# Patient Record
Sex: Male | Born: 1970
Health system: Southern US, Community
[De-identification: ages and names within clinical notes are randomized; demographics above are authoritative.]

## PROBLEM LIST (undated history)

## (undated) DIAGNOSIS — I10 Essential (primary) hypertension: Secondary | ICD-10-CM

## (undated) DIAGNOSIS — F419 Anxiety disorder, unspecified: Secondary | ICD-10-CM

## (undated) HISTORY — PX: SHOULDER SURGERY: SHX246

---

## 2007-07-05 ENCOUNTER — Ambulatory Visit (HOSPITAL_COMMUNITY): Admission: RE | Admit: 2007-07-05 | Discharge: 2007-07-05 | Payer: Self-pay | Admitting: Cardiology

## 2008-02-12 ENCOUNTER — Encounter: Admission: RE | Admit: 2008-02-12 | Discharge: 2008-02-12 | Payer: Self-pay | Admitting: Family Medicine

## 2009-10-31 ENCOUNTER — Emergency Department (HOSPITAL_COMMUNITY)
Admission: EM | Admit: 2009-10-31 | Discharge: 2009-10-31 | Payer: Self-pay | Source: Home / Self Care | Admitting: Emergency Medicine

## 2009-11-01 ENCOUNTER — Emergency Department (HOSPITAL_BASED_OUTPATIENT_CLINIC_OR_DEPARTMENT_OTHER): Admission: EM | Admit: 2009-11-01 | Discharge: 2009-11-01 | Payer: Self-pay | Admitting: Emergency Medicine

## 2010-04-14 LAB — CBC
HCT: 47.4 % (ref 39.0–52.0)
Hemoglobin: 16.5 g/dL (ref 13.0–17.0)
MCH: 31 pg (ref 26.0–34.0)
MCHC: 34.9 g/dL (ref 30.0–36.0)
MCV: 88.7 fL (ref 78.0–100.0)
RBC: 5.34 MIL/uL (ref 4.22–5.81)

## 2010-04-14 LAB — BASIC METABOLIC PANEL
CO2: 25 mEq/L (ref 19–32)
Calcium: 9.1 mg/dL (ref 8.4–10.5)
Chloride: 107 mEq/L (ref 96–112)
GFR calc Af Amer: >60 mL/min (ref >60)
Glucose, Bld: 87 mg/dL (ref 70–99)
Potassium: 4.2 mEq/L (ref 3.5–5.1)
Sodium: 137 mEq/L (ref 135–145)

## 2010-04-14 LAB — POCT CARDIAC MARKERS
CKMB, poc: <1 ng/mL — ABNORMAL LOW (ref 1.0–8.0)
Myoglobin, poc: 54.5 ng/mL (ref 12–200)
Myoglobin, poc: 68.2 ng/mL (ref 12–200)

## 2010-04-14 LAB — DIFFERENTIAL
Basophils Relative: 1 % (ref 0–1)
Eosinophils Absolute: 0.1 10*3/uL (ref 0.0–0.7)
Lymphs Abs: 2.6 10*3/uL (ref 0.7–4.0)
Monocytes Absolute: 0.5 10*3/uL (ref 0.1–1.0)
Monocytes Relative: 5 % (ref 3–12)
Neutrophils Relative %: 66 % (ref 43–77)

## 2010-08-21 ENCOUNTER — Encounter: Payer: Self-pay | Admitting: Emergency Medicine

## 2010-08-21 ENCOUNTER — Emergency Department (HOSPITAL_BASED_OUTPATIENT_CLINIC_OR_DEPARTMENT_OTHER)
Admission: EM | Admit: 2010-08-21 | Discharge: 2010-08-21 | Disposition: A | Discharge status: Home / Self Care | Payer: BC Managed Care – PPO | Attending: Emergency Medicine | Admitting: Emergency Medicine

## 2010-08-21 ENCOUNTER — Emergency Department (INDEPENDENT_AMBULATORY_CARE_PROVIDER_SITE_OTHER): Payer: BC Managed Care – PPO

## 2010-08-21 DIAGNOSIS — R109 Unspecified abdominal pain: Secondary | ICD-10-CM | POA: Insufficient documentation

## 2010-08-21 DIAGNOSIS — I10 Essential (primary) hypertension: Secondary | ICD-10-CM | POA: Insufficient documentation

## 2010-08-21 HISTORY — DX: Essential (primary) hypertension: I10

## 2010-08-21 LAB — URINALYSIS, ROUTINE W REFLEX MICROSCOPIC
Glucose, UA: NEGATIVE mg/dL
Hgb urine dipstick: NEGATIVE
Ketones, ur: NEGATIVE mg/dL
Protein, ur: NEGATIVE mg/dL
Urobilinogen, UA: 0.2 mg/dL (ref 0.0–1.0)

## 2010-08-21 LAB — BASIC METABOLIC PANEL
Calcium: 9.4 mg/dL (ref 8.4–10.5)
Creatinine, Ser: 1 mg/dL (ref 0.50–1.35)
GFR calc Af Amer: >60 mL/min (ref >60)
GFR calc non Af Amer: >60 mL/min (ref >60)

## 2010-08-21 LAB — CBC
HCT: 46.9 % (ref 39.0–52.0)
MCHC: 34.5 g/dL (ref 30.0–36.0)
MCV: 86.7 fL (ref 78.0–100.0)
Platelets: 212 10*3/uL (ref 150–400)
RDW: 13.9 % (ref 11.5–15.5)

## 2010-08-21 LAB — DIFFERENTIAL
Basophils Absolute: 0 10*3/uL (ref 0.0–0.1)
Basophils Relative: 1 % (ref 0–1)
Eosinophils Absolute: 0.3 10*3/uL (ref 0.0–0.7)
Eosinophils Relative: 3 % (ref 0–5)
Monocytes Absolute: 0.8 10*3/uL (ref 0.1–1.0)

## 2010-08-21 MED ORDER — SODIUM CHLORIDE 0.9 % IV BOLUS (SEPSIS)
500.0000 mL | Freq: Once | INTRAVENOUS | Status: AC
  Administered 2010-08-21: 500 mL via INTRAVENOUS

## 2010-08-21 MED ORDER — KETOROLAC TROMETHAMINE 30 MG/ML IJ SOLN
30.0000 mg | Freq: Once | INTRAMUSCULAR | Status: AC
  Administered 2010-08-21: 30 mg via INTRAVENOUS
  Filled 2010-08-21: qty 1

## 2010-08-21 MED ORDER — MORPHINE SULFATE 4 MG/ML IJ SOLN
4.0000 mg | Freq: Once | INTRAMUSCULAR | Status: AC
  Administered 2010-08-21: 4 mg via INTRAVENOUS
  Filled 2010-08-21: qty 1

## 2010-08-21 MED ORDER — ONDANSETRON HCL 4 MG/2ML IJ SOLN
4.0000 mg | Freq: Once | INTRAMUSCULAR | Status: AC
  Administered 2010-08-21: 4 mg via INTRAVENOUS
  Filled 2010-08-21: qty 2

## 2010-08-21 NOTE — ED Provider Notes (Signed)
History     Chief Complaint  Patient presents with  . Flank Pain   HPI Comments: Patient complains of left flank pain that does radiate down his leg since approximately one week ago. He states that it does feel like his past kidney stones. He has not noted any hematuria or dysuria. He's had no fevers. He's had some nausea but no vomiting. He did see his primary care physician on Thursday who prescribed him Percocet and Phenergan for his symptoms. He notes that they made him significantly somnolent. I he's been trying to increase his intake of water as he knows that that helps him past kidney stones but has not noted that he is actually passed a stone yet. He has no fevers. Normal bowel movements. No abdominal pain. No other noted injury or trauma.  Patient is a 40 y.o. male presenting with flank pain. The history is provided by the patient and the spouse.  Flank Pain This is a recurrent problem. The current episode started more than 2 days ago. The problem occurs daily. The problem has not changed since onset.Pertinent negatives include no chest pain, no abdominal pain, no headaches and no shortness of breath. The symptoms are aggravated by nothing. The symptoms are relieved by narcotics. He has tried water and rest for the symptoms. The treatment provided moderate relief.    Past Medical History  Diagnosis Date  . Kidney stones   . Hypertension   . Legionnaire disease     Past Surgical History  Procedure Date  . Shoulder surgery     No family history on file.  History  Substance Use Topics  . Smoking status: Current Everyday Smoker  . Smokeless tobacco: Current User  . Alcohol Use: No      Review of Systems  Constitutional: Negative.  Negative for fever and chills.  HENT: Negative.   Eyes: Negative.  Negative for discharge and redness.  Respiratory: Negative.  Negative for cough and shortness of breath.   Cardiovascular: Negative.  Negative for chest pain.    Gastrointestinal: Negative.  Negative for nausea, vomiting and abdominal pain.  Genitourinary: Positive for flank pain. Negative for hematuria.  Musculoskeletal: Negative.  Negative for back pain.  Skin: Negative.  Negative for color change and rash.  Neurological: Negative for syncope and headaches.  Hematological: Negative.  Negative for adenopathy.  Psychiatric/Behavioral: Negative.  Negative for confusion.  All other systems reviewed and are negative.    Physical Exam  BP 133/91  Pulse 67  Temp(Src) 98 F (36.7 C) (Oral)  Resp 16  Ht 6' (1.829 m)  Wt 235 lb (106.595 kg)  BMI 31.87 kg/m2  SpO2 98%  Physical Exam  Constitutional: He is oriented to person, place, and time. He appears well-developed and well-nourished.  Non-toxic appearance. He does not have a sickly appearance.  HENT:  Head: Normocephalic and atraumatic.  Eyes: Conjunctivae, EOM and lids are normal. Pupils are equal, round, and reactive to light.  Neck: Trachea normal, normal range of motion and full passive range of motion without pain. Neck supple.  Cardiovascular: Regular rhythm and normal heart sounds.   Pulmonary/Chest: Effort normal and breath sounds normal. No respiratory distress.  Abdominal: Soft. Normal appearance. He exhibits no distension. There is no tenderness. There is no rebound and no CVA tenderness.  Musculoskeletal: Normal range of motion.  Neurological: He is alert and oriented to person, place, and time. He has normal strength.  Skin: Skin is warm, dry and intact. No rash noted.  Psychiatric: He has a normal mood and affect. His speech is normal and behavior is normal. Judgment and thought content normal. Cognition and memory are normal.    ED Course  Procedures  MDM  Patient's history was concerning for possible renal stone. At this time he has no urinary tract infection or hematuria. Patient has no signs of stone on his CAT scan at this time. He has no signs of other intra-abdominal  pathology. He has a benign abdominal exam and so corresponds with lack of other intra-abdominal pathology. He is at his normal bowel movements. Patient has no fevers no signs of acute infection. At this point in time I will discharge the patient is instructions to followup with his primary care physician this week he may potentially have a musculoskeletal injury. 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  Results for orders placed during the hospital encounter of 08/21/10  URINALYSIS, ROUTINE W REFLEX MICROSCOPIC      Component Value Range   Color, Urine YELLOW  YELLOW    Appearance CLEAR  CLEAR    Specific Gravity, Urine 1.020  1.005 - 1.030    pH 7.0  5.0 - 8.0    Glucose, UA NEGATIVE  NEGATIVE (mg/dL)   Hgb urine dipstick NEGATIVE  NEGATIVE    Bilirubin Urine NEGATIVE  NEGATIVE    Ketones, ur NEGATIVE  NEGATIVE (mg/dL)   Protein, ur NEGATIVE  NEGATIVE (mg/dL)   Urobilinogen, UA 0.2  0.0 - 1.0 (mg/dL)   Nitrite NEGATIVE  NEGATIVE    Leukocytes, UA NEGATIVE  NEGATIVE   CBC      Component Value Range   WBC 8.8  4.0 - 10.5 (K/uL)   RBC 5.41  4.22 - 5.81 (MIL/uL)   Hemoglobin 16.2  13.0 - 17.0 (g/dL)   HCT 96.0  45.4 - 09.8 (%)   MCV 86.7  78.0 - 100.0 (fL)   MCH 29.9  26.0 - 34.0 (pg)   MCHC 34.5  30.0 - 36.0 (g/dL)   RDW 11.9  14.7 - 82.9 (%)   Platelets 212  150 - 400 (K/uL)  DIFFERENTIAL      Component Value Range   Neutrophils Relative 53  43 - 77 (%)   Neutro Abs 4.7  1.7 - 7.7 (K/uL)   Lymphocytes Relative 35  12 - 46 (%)   Lymphs Abs 3.0  0.7 - 4.0 (K/uL)   Monocytes Relative 9  3 - 12 (%)   Monocytes Absolute 0.8  0.1 - 1.0 (K/uL)   Eosinophils Relative 3  0 - 5 (%)   Eosinophils Absolute 0.3  0.0 - 0.7 (K/uL)   Basophils Relative 1  0 - 1 (%)   Basophils Absolute 0.0  0.0 - 0.1 (K/uL)  BASIC METABOLIC PANEL      Component Value Range   Sodium 141  135 - 145 (mEq/L)   Potassium 4.0  3.5 - 5.1 (mEq/L)   Chloride 104  96 - 112 (mEq/L)   CO2  28  19 - 32 (mEq/L)   Glucose, Bld 84  70 - 99 (mg/dL)   BUN 15  6 - 23 (mg/dL)   Creatinine, Ser 5.62  0.50 - 1.35 (mg/dL)   Calcium 9.4  8.4 - 13.0 (mg/dL)   GFR calc non Af Amer >60  >60 (mL/min)   GFR calc Af Amer >60  >60 (mL/min)   Ct Abdomen Pelvis Wo Contrast  08/21/2010  *RADIOLOGY REPORT*  Clinical Data: Left  flank pain.  CT ABDOMEN AND PELVIS WITHOUT CONTRAST  Technique:  Multidetector CT imaging of the abdomen and pelvis was performed following the standard protocol without intravenous contrast.  Comparison: CT scan 02/12/2008.  Findings: The lung bases are clear except for dependent atelectasis.  The unenhanced appearance of the liver is unremarkable.  The gallbladder appears normal.  No common bile duct dilatation.  The pancreas appears normal.  The spleen is normal in size.  No focal lesions.  The adrenal glands and kidneys are normal.  No hydronephrosis.  No obstructing ureteral calculi.  No bladder calculi.  The stomach, duodenum, small bowel and colon demonstrate no significant abnormalities without oral contrast.  The appendix is normal.  No mesenteric or retroperitoneal masses or adenopathy. The aorta is normal.  The bladder, prostate gland and seminal vesicles appear normal.  No pelvic mass, adenopathy or free pelvic fluid collections.  No inguinal mass or hernia.  The bony pelvis is intact.  IMPRESSION: Unremarkable noncontrast CT examination of the abdomen/pelvis.  Original Report Authenticated By: P. Loralie Champagne, M.D.          Nat Christen, MD 08/21/10 2032

## 2010-08-21 NOTE — ED Notes (Signed)
Pt c/o LT flank pain since Fri- saw PMD for same, has rx for pain, not having relief

## 2010-11-01 ENCOUNTER — Emergency Department (HOSPITAL_BASED_OUTPATIENT_CLINIC_OR_DEPARTMENT_OTHER)
Admission: EM | Admit: 2010-11-01 | Discharge: 2010-11-01 | Disposition: A | Discharge status: Home / Self Care | Payer: BC Managed Care – PPO | Attending: Emergency Medicine | Admitting: Emergency Medicine

## 2010-11-01 ENCOUNTER — Emergency Department (INDEPENDENT_AMBULATORY_CARE_PROVIDER_SITE_OTHER): Payer: BC Managed Care – PPO

## 2010-11-01 ENCOUNTER — Other Ambulatory Visit: Payer: Self-pay

## 2010-11-01 ENCOUNTER — Encounter (HOSPITAL_BASED_OUTPATIENT_CLINIC_OR_DEPARTMENT_OTHER): Payer: Self-pay | Admitting: *Deleted

## 2010-11-01 DIAGNOSIS — R079 Chest pain, unspecified: Secondary | ICD-10-CM | POA: Insufficient documentation

## 2010-11-01 DIAGNOSIS — I10 Essential (primary) hypertension: Secondary | ICD-10-CM | POA: Insufficient documentation

## 2010-11-01 DIAGNOSIS — F411 Generalized anxiety disorder: Secondary | ICD-10-CM | POA: Insufficient documentation

## 2010-11-01 DIAGNOSIS — R9431 Abnormal electrocardiogram [ECG] [EKG]: Secondary | ICD-10-CM

## 2010-11-01 DIAGNOSIS — R0789 Other chest pain: Secondary | ICD-10-CM

## 2010-11-01 DIAGNOSIS — F17200 Nicotine dependence, unspecified, uncomplicated: Secondary | ICD-10-CM | POA: Insufficient documentation

## 2010-11-01 DIAGNOSIS — F419 Anxiety disorder, unspecified: Secondary | ICD-10-CM

## 2010-11-01 DIAGNOSIS — Z79899 Other long term (current) drug therapy: Secondary | ICD-10-CM | POA: Insufficient documentation

## 2010-11-01 LAB — BASIC METABOLIC PANEL
Chloride: 107 mEq/L (ref 96–112)
GFR calc Af Amer: >90 mL/min (ref >90)
GFR calc non Af Amer: >90 mL/min (ref >90)
Glucose, Bld: 110 mg/dL — ABNORMAL HIGH (ref 70–99)
Potassium: 3.7 mEq/L (ref 3.5–5.1)
Sodium: 140 mEq/L (ref 135–145)

## 2010-11-01 LAB — TROPONIN I: Troponin I: <0.3 ng/mL (ref <0.30)

## 2010-11-01 LAB — DIFFERENTIAL
Basophils Relative: 1 % (ref 0–1)
Eosinophils Absolute: 0.2 10*3/uL (ref 0.0–0.7)
Lymphs Abs: 2.6 10*3/uL (ref 0.7–4.0)
Neutro Abs: 4 10*3/uL (ref 1.7–7.7)
Neutrophils Relative %: 54 % (ref 43–77)

## 2010-11-01 LAB — CBC
MCH: 30.4 pg (ref 26.0–34.0)
Platelets: 237 10*3/uL (ref 150–400)
RBC: 5.06 MIL/uL (ref 4.22–5.81)

## 2010-11-01 MED ORDER — LORAZEPAM 1 MG PO TABS
1.0000 mg | ORAL_TABLET | Freq: Once | ORAL | Status: AC
  Administered 2010-11-01: 1 mg via ORAL
  Filled 2010-11-01: qty 1

## 2010-11-01 NOTE — ED Provider Notes (Signed)
History     CSN: [id number] Arrival date & time: 11/01/2010  2:59 PM  Chief Complaint  Patient presents with  . Chest Pain    (Consider location/radiation/quality/duration/timing/severity/associated sxs/prior treatment) HPI Comments: Patient was sent by his primary care provider. Patient went to see them today as he had been having increasing left-sided chest pain with radiation to his left neck. The ascending colon on over the past 3 weeks. During this time the patient's normal prescription for Klonopin 3 mg by mouth daily had been inadvertently decreased to 1 mg by mouth daily after a computer system change at his PCPs office. Patient had been doing okay with this. 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 She's been back this week in his chest pain had returned. He had associated this with his anxiety and followed up with his primary care provider. They did an EKG today that showed T-wave inversions in several leads that were new for the patient. For this reason he was sent for evaluation.  Patient is a 40 y.o. male presenting with chest pain. The history is provided by the patient. No language interpreter was used.  Chest Pain The chest pain began more than 2 weeks ago. Chest pain occurs intermittently. The chest pain is improving. The pain is associated with stress. At its most intense, the pain is at 4/10. The pain is currently at 1/10. The severity of the pain is moderate. The quality of the pain is described as aching. The pain does not radiate. Chest pain is worsened by stress. Pertinent negatives for primary symptoms include no fever, no fatigue, no syncope, no shortness of breath, no cough, no wheezing, no palpitations, no abdominal pain, no nausea, no vomiting, no dizziness and no altered mental status. Treatments tried: Anxiety medications. Risk factors include male gender and stress. Past medical history  comments: No significant past medical history. Family history comments: No significant family history.  Procedure history is positive for exercise treadmill test. Procedure history comments: This was negative several years ago.     Past Medical History  Diagnosis Date  . Kidney stones   . Hypertension   . Legionnaire disease     Past Surgical History  Procedure Date  . Shoulder surgery     No family history on file.  History  Substance Use Topics  . Smoking status: Current Everyday Smoker  . Smokeless tobacco: Current User  . Alcohol Use: No      Review of Systems  Constitutional: Negative.  Negative for fever and fatigue.  HENT: Negative.   Eyes: Negative.   Respiratory: Negative for cough, shortness of breath and wheezing.   Cardiovascular: Positive for chest pain. Negative for palpitations and syncope.  Gastrointestinal: Negative.  Negative for nausea, vomiting and abdominal pain.  Genitourinary: Negative.   Musculoskeletal: Negative.   Skin: Negative.   Neurological: Negative.  Negative for dizziness.  Hematological: Negative.   Psychiatric/Behavioral: Negative for altered mental status.       Anxiety  All other systems reviewed and are negative.    Allergies  Review of patient's allergies indicates no known allergies.  Home Medications   Current Outpatient Rx  Name Route Sig Dispense Refill  . CLONAZEPAM 1 MG PO TABS Oral Take 1 mg by mouth 3 (three) times daily as needed. For anxiety      . METOPROLOL TARTRATE 100 MG PO TABS Oral Take 100 mg by  mouth daily.      Marland Kitchen RANITIDINE HCL 150 MG PO TABS Oral Take 150 mg by mouth daily as needed. Acid reflux      . TRAMADOL HCL 50 MG PO TABS Oral Take 50-100 mg by mouth every 6 (six) hours as needed. For pain       BP 146/88  Pulse 73  Temp(Src) 98.2 F (36.8 C) (Oral)  Resp 20  SpO2 98%  Physical Exam  Nursing note and vitals reviewed. Constitutional: He is oriented to person, place, and time. He  appears well-developed and well-nourished. No distress.  HENT:  Head: Normocephalic and atraumatic.  Eyes: Conjunctivae and EOM are normal. Pupils are equal, round, and reactive to light.  Neck: Normal range of motion. Neck supple.  Cardiovascular: Normal rate, regular rhythm, normal heart sounds and intact distal pulses.  Exam reveals no gallop and no friction rub.   No murmur heard. Pulmonary/Chest: Effort normal and breath sounds normal. No respiratory distress. He has no wheezes. He has no rales.  Abdominal: Soft. Bowel sounds are normal. He exhibits no distension. There is no tenderness. There is no rebound and no guarding.  Musculoskeletal: Normal range of motion. He exhibits no edema and no tenderness.  Neurological: He is alert and oriented to person, place, and time. No cranial nerve deficit. He exhibits normal muscle tone. Coordination normal.  Skin: Skin is warm and dry. No rash noted.  Psychiatric:       Anxious    ED Course  Procedures (including critical care time)   Date: 11/01/2010  Rate: 70  Rhythm: normal sinus rhythm  QRS Axis: normal  Intervals: normal  ST/T Wave abnormalities: nonspecific T wave changes  Conduction Disutrbances:Incomplete right bundle-branch block  Narrative Interpretation: No change from EKG obtained at PCP office today. Patient has new T-wave inversions in leads 3, aVF, and V4 through V6 from previous EKG on 10/31/2009.  Old EKG Reviewed: changes noted   Labs Reviewed  BASIC METABOLIC PANEL - Abnormal; Notable for the following:    Glucose, Bld 110 (*)    All other components within normal limits  CBC  DIFFERENTIAL  TROPONIN I   Dg Chest 2 View  11/01/2010  *RADIOLOGY REPORT*  Clinical Data: Abnormal EKG.  Some chest tightness.  CHEST - 2 VIEW  Comparison: 10/31/2009  Findings:  The heart size and mediastinal contours are within normal limits.  Both lungs are clear.  The visualized skeletal structures are unremarkable. No significant  change from priors.  IMPRESSION: No active cardiopulmonary disease.  Original Report Authenticated By: Elsie Stain, M.D.     1. Chest pain   2. Anxiety       MDM  Patient was evaluated by myself and was quite anxious. He did report that he had been having intermittent episodes of chest pain that these were similar to all of the anxiety attacks he has had in the past. Patient has seen a cardiologist previously and had complete workup including negative stress test. Patient is here today as his primary care provider sent him after hent discovering nonspecific T wave changes on his EKG. Patient has only vague discomfort in his chest which he reports is consistent with his anxiety. Though his PCP increase his anxiety prescription dosage today he has not had a chance to take it. Patient does have nonspecific T wave changes noted. Patient will be given Ativan 1 mg by mouth here. Additionally chest x-ray, CBC, been met, troponin will be ordered and reviewed.  Patient is hemodynamically stable at this time.  5:18 PM Patient felt much better following Ativan. Troponin, CBC, renal panel, and chest x-ray were all unremarkable. Given that patient's symptoms have been present for over 8 hours no additional cardiac enzymes were felt necessary today. Patient was discharged home in good condition. He did ask that a copy of his report be forwarded to his primary care physician. This information was given to her secretary and she will attempt to take care of this. Additionally the patient was given a work excuse for tomorrow in case he is still waiting to get back to baseline as he will be starting his new dose of Klonopin tomorrow morning.        Cyndra Numbers, MD 11/01/10 1719

## 2010-11-01 NOTE — ED Provider Notes (Deleted)
Date: 11/01/2010  Rate: 70  Rhythm: normal sinus rhythm  QRS Axis: normal  Intervals: normal  ST/T Wave abnormalities: nonspecific T wave changes  Conduction Disutrbances:Incomplete right bundle-branch block  Narrative Interpretation: No change from EKG obtained at PCP office today. Patient has new T-wave inversions in leads 3, aVF, and V4 through V6 from previous EKG on 10/31/2009.  Old EKG Reviewed: changes noted   Cyndra Numbers, MD 11/01/10 1459

## 2010-11-01 NOTE — ED Notes (Signed)
4 years ago chest pain. Was diagnosed with anxiety. Has been under increased stress recently and Klonipin was decreased from 3mg  to 1mg . Since that time he has been having anxiety attacks. Today after carried his 25# son up stairs he got sob and had tightness in his chest. States this is how he normally feels when he is having anxiety. Was concerned enough to go see his MD where he had a questionable EKG. Was sent here for further evaluation.

## 2011-02-10 ENCOUNTER — Emergency Department (HOSPITAL_BASED_OUTPATIENT_CLINIC_OR_DEPARTMENT_OTHER)
Admission: EM | Admit: 2011-02-10 | Discharge: 2011-02-10 | Disposition: A | Discharge status: Home / Self Care | Payer: Worker's Compensation | Attending: Emergency Medicine | Admitting: Emergency Medicine

## 2011-02-10 ENCOUNTER — Encounter (HOSPITAL_BASED_OUTPATIENT_CLINIC_OR_DEPARTMENT_OTHER): Payer: Self-pay | Admitting: Emergency Medicine

## 2011-02-10 DIAGNOSIS — H16139 Photokeratitis, unspecified eye: Secondary | ICD-10-CM | POA: Insufficient documentation

## 2011-02-10 DIAGNOSIS — H5710 Ocular pain, unspecified eye: Secondary | ICD-10-CM | POA: Insufficient documentation

## 2011-02-10 DIAGNOSIS — I10 Essential (primary) hypertension: Secondary | ICD-10-CM | POA: Insufficient documentation

## 2011-02-10 MED ORDER — OXYCODONE-ACETAMINOPHEN 5-325 MG PO TABS: 1.0000 | ORAL_TABLET | Freq: Four times a day (QID) | ORAL | Status: AC | PRN

## 2011-02-10 MED ORDER — FLUORESCEIN SODIUM 1 MG OP STRP
1.0000 | ORAL_STRIP | Freq: Once | OPHTHALMIC | Status: AC
  Administered 2011-02-10: 1 via OPHTHALMIC
  Filled 2011-02-10: qty 1

## 2011-02-10 MED ORDER — TETRACAINE HCL 0.5 % OP SOLN
2.0000 [drp] | Freq: Once | OPHTHALMIC | Status: AC
  Administered 2011-02-10: 2 [drp] via OPHTHALMIC
  Filled 2011-02-10: qty 2

## 2011-02-10 MED ORDER — OXYCODONE-ACETAMINOPHEN 5-325 MG PO TABS
1.0000 | ORAL_TABLET | Freq: Once | ORAL | Status: AC
  Administered 2011-02-10: 1 via ORAL
  Filled 2011-02-10: qty 1

## 2011-02-10 NOTE — ED Provider Notes (Signed)
History     CSN: [id number]  Arrival date & time 02/10/11  4098   First MD Initiated Contact with Patient 02/10/11 0701      Chief Complaint  Patient presents with  . Eye Injury    (Consider location/radiation/quality/duration/timing/severity/associated sxs/prior treatment) Patient is a 41 y.o. male presenting with eye injury. The history is provided by the patient.  Eye Injury This is a new problem.   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 He states it is directly in the eye as her a few seconds and then peripherally exposed for longer period. Now he has pain tearing and blurring vision. 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 No fevers. He does not wear contacts.  Past Medical History  Diagnosis Date  . Hypertension     Past Surgical History  Procedure Date  . Shoulder surgery   . Shoulder surgery     No family history on file.  History  Substance Use Topics  . Smoking status: Smoker, Current Status Unknown  . Smokeless tobacco: Current User  . Alcohol Use: No      Review of Systems  Constitutional: Negative for fever.  HENT: Negative for ear pain.   Eyes: Negative for discharge and itching.       Tearing  Respiratory: Negative for cough.     Allergies  Review of patient's allergies indicates no known allergies.  Home Medications   Current Outpatient Rx  Name Route Sig Dispense Refill  . CLONAZEPAM 1 MG PO TABS Oral Take 1 mg by mouth 3 (three) times daily as needed. For anxiety      . METOPROLOL TARTRATE 100 MG PO TABS Oral Take 100 mg by mouth daily.      . OXYCODONE-ACETAMINOPHEN 5-325 MG PO TABS Oral Take 1-2 tablets by mouth every 6 (six) hours as needed for pain. 10 tablet 0  . RANITIDINE HCL 150 MG PO TABS Oral Take 150 mg by mouth daily as needed. Acid reflux      . TRAMADOL HCL 50 MG PO TABS Oral Take 50-100 mg by mouth every 6 (six) hours as needed. For pain       BP 154/93  Pulse 61   Temp(Src) 97.6 F (36.4 C) (Oral)  Resp 18  SpO2 97%  Physical Exam  Constitutional: He appears well-developed and well-nourished.  HENT:  Head: Normocephalic.  Eyes: EOM are normal. No scleral icterus.       Pupils are 3 mm and somewhat constricted. No cell or flare on slit lamp no fluorescein uptake on the cornea. Conjunctiva is injected.    ED Course  Procedures (including critical care time)  Labs Reviewed - No data to display No results found.   1. UV keratitis       MDM  Bilateral eye pain after exposure to ultraviolet light last night. His pain was not relieved by tetracaine. His visual acuity is good. His intraocular pressures were 19 on the left and 23 on the right. His pupils are somewhat constricted. There is no cell or flare on examination. There was no corneal uptake of fluorescein. I discussed with Dr. Gwen Pounds, he'll be able to see the patient later this morning in the office on [facility]. I will fax a note to the office.        Juliet Rude. Rubin Payor, MD 02/10/11 [phone]

## 2011-02-10 NOTE — ED Notes (Signed)
Pt reports burning in eyes after having UV light directly in eyes last pm while working

## 2011-03-29 ENCOUNTER — Emergency Department (HOSPITAL_BASED_OUTPATIENT_CLINIC_OR_DEPARTMENT_OTHER)
Admission: EM | Admit: 2011-03-29 | Discharge: 2011-03-30 | Disposition: A | Discharge status: Home / Self Care | Payer: Self-pay | Attending: Emergency Medicine | Admitting: Emergency Medicine

## 2011-03-29 ENCOUNTER — Emergency Department (INDEPENDENT_AMBULATORY_CARE_PROVIDER_SITE_OTHER): Payer: BC Managed Care – PPO

## 2011-03-29 ENCOUNTER — Encounter (HOSPITAL_BASED_OUTPATIENT_CLINIC_OR_DEPARTMENT_OTHER): Payer: Self-pay | Admitting: Emergency Medicine

## 2011-03-29 DIAGNOSIS — R0989 Other specified symptoms and signs involving the circulatory and respiratory systems: Secondary | ICD-10-CM

## 2011-03-29 DIAGNOSIS — R059 Cough, unspecified: Secondary | ICD-10-CM

## 2011-03-29 DIAGNOSIS — J3489 Other specified disorders of nose and nasal sinuses: Secondary | ICD-10-CM | POA: Insufficient documentation

## 2011-03-29 DIAGNOSIS — R42 Dizziness and giddiness: Secondary | ICD-10-CM

## 2011-03-29 DIAGNOSIS — J069 Acute upper respiratory infection, unspecified: Secondary | ICD-10-CM | POA: Insufficient documentation

## 2011-03-29 DIAGNOSIS — R05 Cough: Secondary | ICD-10-CM | POA: Insufficient documentation

## 2011-03-29 DIAGNOSIS — R0602 Shortness of breath: Secondary | ICD-10-CM

## 2011-03-29 DIAGNOSIS — I10 Essential (primary) hypertension: Secondary | ICD-10-CM | POA: Insufficient documentation

## 2011-03-29 HISTORY — DX: Anxiety disorder, unspecified: F41.9

## 2011-03-29 MED ORDER — ALBUTEROL SULFATE HFA 108 (90 BASE) MCG/ACT IN AERS
2.0000 | INHALATION_SPRAY | RESPIRATORY_TRACT | Status: DC | PRN
  Administered 2011-03-30: 2 via RESPIRATORY_TRACT
  Filled 2011-03-29: qty 6.7
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
## 2011-03-29 NOTE — ED Notes (Signed)
Pt c/o cough and chest congestion intermittently x 1 month. Today pt feels more week and lower back pain with nausea.

## 2011-03-30 MED ORDER — AZITHROMYCIN 250 MG PO TABS: ORAL_TABLET | ORAL | Status: AC

## 2011-03-30 MED ORDER — ACETAMINOPHEN-CODEINE 120-12 MG/5ML PO SUSP: 5.0000 mL | Freq: Three times a day (TID) | ORAL | Status: AC | PRN

## 2011-03-30 NOTE — Discharge Instructions (Signed)

## 2012-02-19 ENCOUNTER — Encounter (HOSPITAL_BASED_OUTPATIENT_CLINIC_OR_DEPARTMENT_OTHER): Payer: Self-pay | Admitting: *Deleted

## 2012-02-19 ENCOUNTER — Emergency Department (HOSPITAL_BASED_OUTPATIENT_CLINIC_OR_DEPARTMENT_OTHER): Payer: Self-pay

## 2012-02-19 ENCOUNTER — Emergency Department (HOSPITAL_BASED_OUTPATIENT_CLINIC_OR_DEPARTMENT_OTHER)
Admission: EM | Admit: 2012-02-19 | Discharge: 2012-02-19 | Disposition: A | Discharge status: Home / Self Care | Payer: Self-pay | Attending: Emergency Medicine | Admitting: Emergency Medicine

## 2012-02-19 DIAGNOSIS — J069 Acute upper respiratory infection, unspecified: Secondary | ICD-10-CM | POA: Insufficient documentation

## 2012-02-19 DIAGNOSIS — R059 Cough, unspecified: Secondary | ICD-10-CM

## 2012-02-19 DIAGNOSIS — Z87891 Personal history of nicotine dependence: Secondary | ICD-10-CM | POA: Insufficient documentation

## 2012-02-19 DIAGNOSIS — F411 Generalized anxiety disorder: Secondary | ICD-10-CM | POA: Insufficient documentation

## 2012-02-19 DIAGNOSIS — Z79899 Other long term (current) drug therapy: Secondary | ICD-10-CM | POA: Insufficient documentation

## 2012-02-19 DIAGNOSIS — R05 Cough: Secondary | ICD-10-CM

## 2012-02-19 DIAGNOSIS — I10 Essential (primary) hypertension: Secondary | ICD-10-CM | POA: Insufficient documentation

## 2012-02-19 MED ORDER — IBUPROFEN 600 MG PO TABS: 600.0000 mg | ORAL_TABLET | Freq: Four times a day (QID) | ORAL | Status: AC | PRN

## 2012-02-19 MED ORDER — ALBUTEROL SULFATE HFA 108 (90 BASE) MCG/ACT IN AERS
2.0000 | INHALATION_SPRAY | Freq: Once | RESPIRATORY_TRACT | Status: AC
  Administered 2012-02-19: 2 via RESPIRATORY_TRACT
  Filled 2012-02-19: qty 6.7

## 2012-02-19 MED ORDER — GUAIFENESIN-CODEINE 100-10 MG/5ML PO SYRP: 5.0000 mL | ORAL_SOLUTION | Freq: Three times a day (TID) | ORAL | Status: AC | PRN

## 2012-02-19 MED ORDER — LORATADINE 10 MG PO TABS: 10.0000 mg | ORAL_TABLET | Freq: Every day | ORAL | Status: AC

## 2012-02-19 NOTE — ED Notes (Signed)
Cough for over a week. Was getting better but worse last night. No fever.

## 2012-02-19 NOTE — ED Provider Notes (Signed)
History     CSN: [id number]  Arrival date & time 02/19/12  1614   First MD Initiated Contact with Patient 02/19/12 1735      Chief Complaint  Patient presents with  . Cough    (Consider location/radiation/quality/duration/timing/severity/associated sxs/prior treatment) Patient is a 42 y.o. male presenting with cough. The history is provided by the patient. No language interpreter was used.  Cough This is a new problem. The current episode started more than 1 week ago. The problem occurs constantly. The problem has not changed since onset.The cough is non-productive. There has been no fever. The fever has been present for 5 days or more. Associated symptoms include ear congestion and rhinorrhea. Pertinent negatives include no chest pain, no chills, no ear pain, no sore throat, no shortness of breath and no wheezing. He has tried decongestants for the symptoms. The treatment provided no relief. Smoker: wife smokes. His past medical history is significant for bronchitis. His past medical history does not include pneumonia, bronchiectasis, COPD, emphysema or asthma.   42 year old male coming in with complaint of a cough x8 days with no fever. States that the cough is worse when he lays down. He is taking over-the-counter guaifenesin with no improvement. Patient's wife smokes around him. No chest pain, shortness of breath, fever, nausea vomiting or diarrhea.  Past Medical History  Diagnosis Date  . Hypertension   . Anxiety     Past Surgical History  Procedure Date  . Shoulder surgery   . Shoulder surgery     No family history on file.  History  Substance Use Topics  . Smoking status: Former Games developer  . Smokeless tobacco: Current User  . Alcohol Use: No      Review of Systems  Constitutional: Negative.  Negative for chills.  HENT: Positive for rhinorrhea. Negative for ear pain and sore throat.   Eyes: Negative.   Respiratory: Positive for cough. Negative for shortness of breath  and wheezing.   Cardiovascular: Negative.  Negative for chest pain and leg swelling.  Gastrointestinal: Negative.  Negative for nausea and diarrhea.  Neurological: Negative.   Psychiatric/Behavioral: Negative.   All other systems reviewed and are negative.    Allergies  Review of patient's allergies indicates no known allergies.  Home Medications   Current Outpatient Rx  Name  Route  Sig  Dispense  Refill  . ZANTAC PO   Oral   Take by mouth.         . CLONAZEPAM 1 MG PO TABS   Oral   Take 1 mg by mouth 3 (three) times daily as needed. For anxiety           . METOPROLOL TARTRATE 100 MG PO TABS   Oral   Take 100 mg by mouth daily.           . TRAMADOL HCL 50 MG PO TABS   Oral   Take 50-100 mg by mouth every 6 (six) hours as needed. For pain            There were no vitals taken for this visit.  Physical Exam  Nursing note and vitals reviewed. Constitutional: He is oriented to person, place, and time. He appears well-developed and well-nourished.  HENT:  Head: Normocephalic.  Eyes: Conjunctivae normal and EOM are normal. Pupils are equal, round, and reactive to light.  Neck: Normal range of motion. Neck supple.  Cardiovascular: Normal rate.   Pulmonary/Chest: Effort normal and breath sounds normal. No respiratory distress. He has  no wheezes. He has no rales.  Abdominal: Soft.  Musculoskeletal: Normal range of motion.  Neurological: He is alert and oriented to person, place, and time.  Skin: Skin is warm and dry.  Psychiatric: He has a normal mood and affect.    ED Course  Procedures (including critical care time)  Labs Reviewed - No data to display Dg Chest 2 View  02/19/2012  *RADIOLOGY REPORT*  Clinical Data: Cough  CHEST - 2 VIEW  Comparison: 03/29/2011  Findings: Cardiomediastinal silhouette is stable.  No acute infiltrate or pleural effusion.  No pulmonary edema.  Bony thorax is stable.  IMPRESSION: No active disease.  No significant change.    Original Report Authenticated By: Natasha Mead, M.D.      No diagnosis found.    MDM  Cough with no pneumonia on the chest x-ray reviewed by myself.   Albuterol inhaler with rx for claritin and ibuprofen and robitussin ac.  He will follow up with pcp of choice or return for worsening symptoms.         Remi Haggard, NP 02/20/12 [phone]

## 2012-02-20 NOTE — ED Provider Notes (Signed)
Medical screening examination/treatment/procedure(s) were performed by non-physician practitioner and as supervising physician I was immediately available for consultation/collaboration.   Nikeshia Keetch B. Bernette Mayers, MD 02/20/12 1527

## 2012-03-09 IMAGING — CR DG CHEST 2V
2 series · 2 of 2 positions shown · non-contrast
Comparison: Chest radiograph performed 11/01/2010

CLINICAL DATA: Cough, congestion and shortness of breath.
Dizziness and lower back pain.  History of smoking.

CHEST - 2 VIEW

[w chest pa]
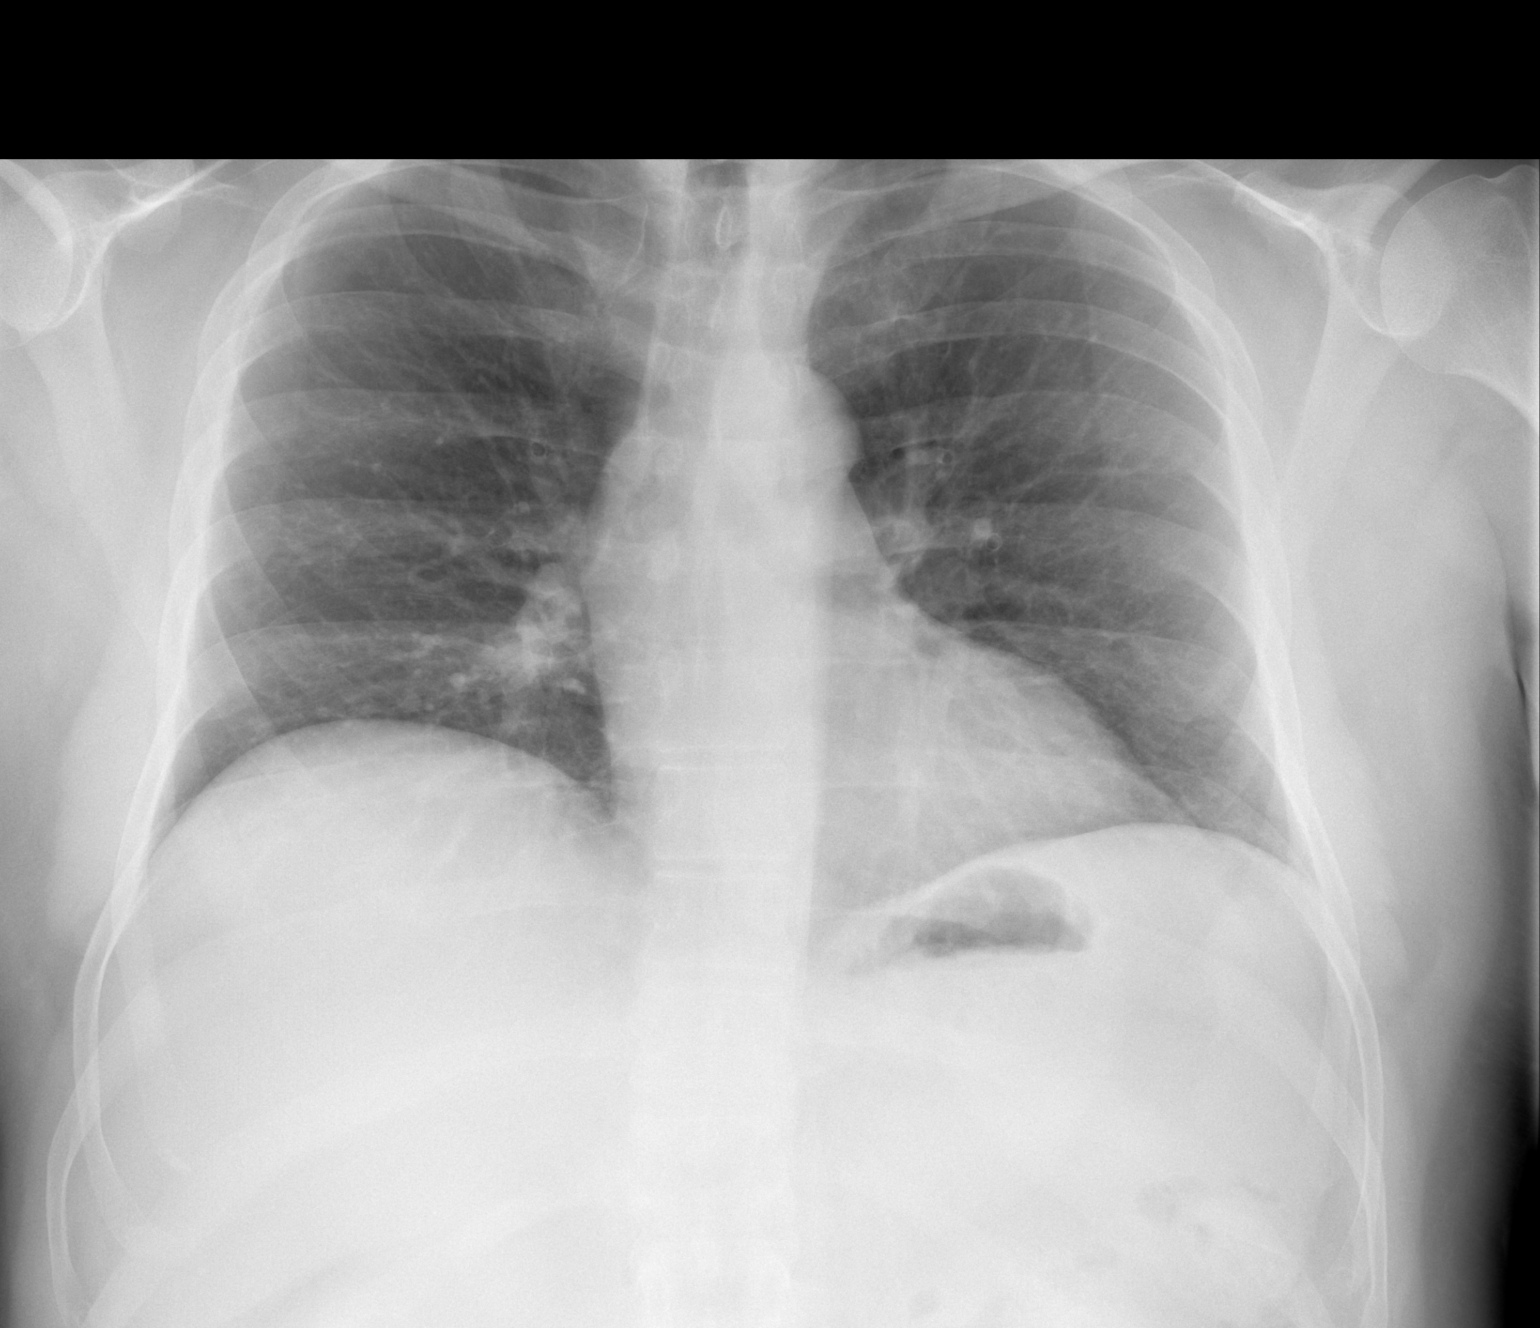

[w chest lat]
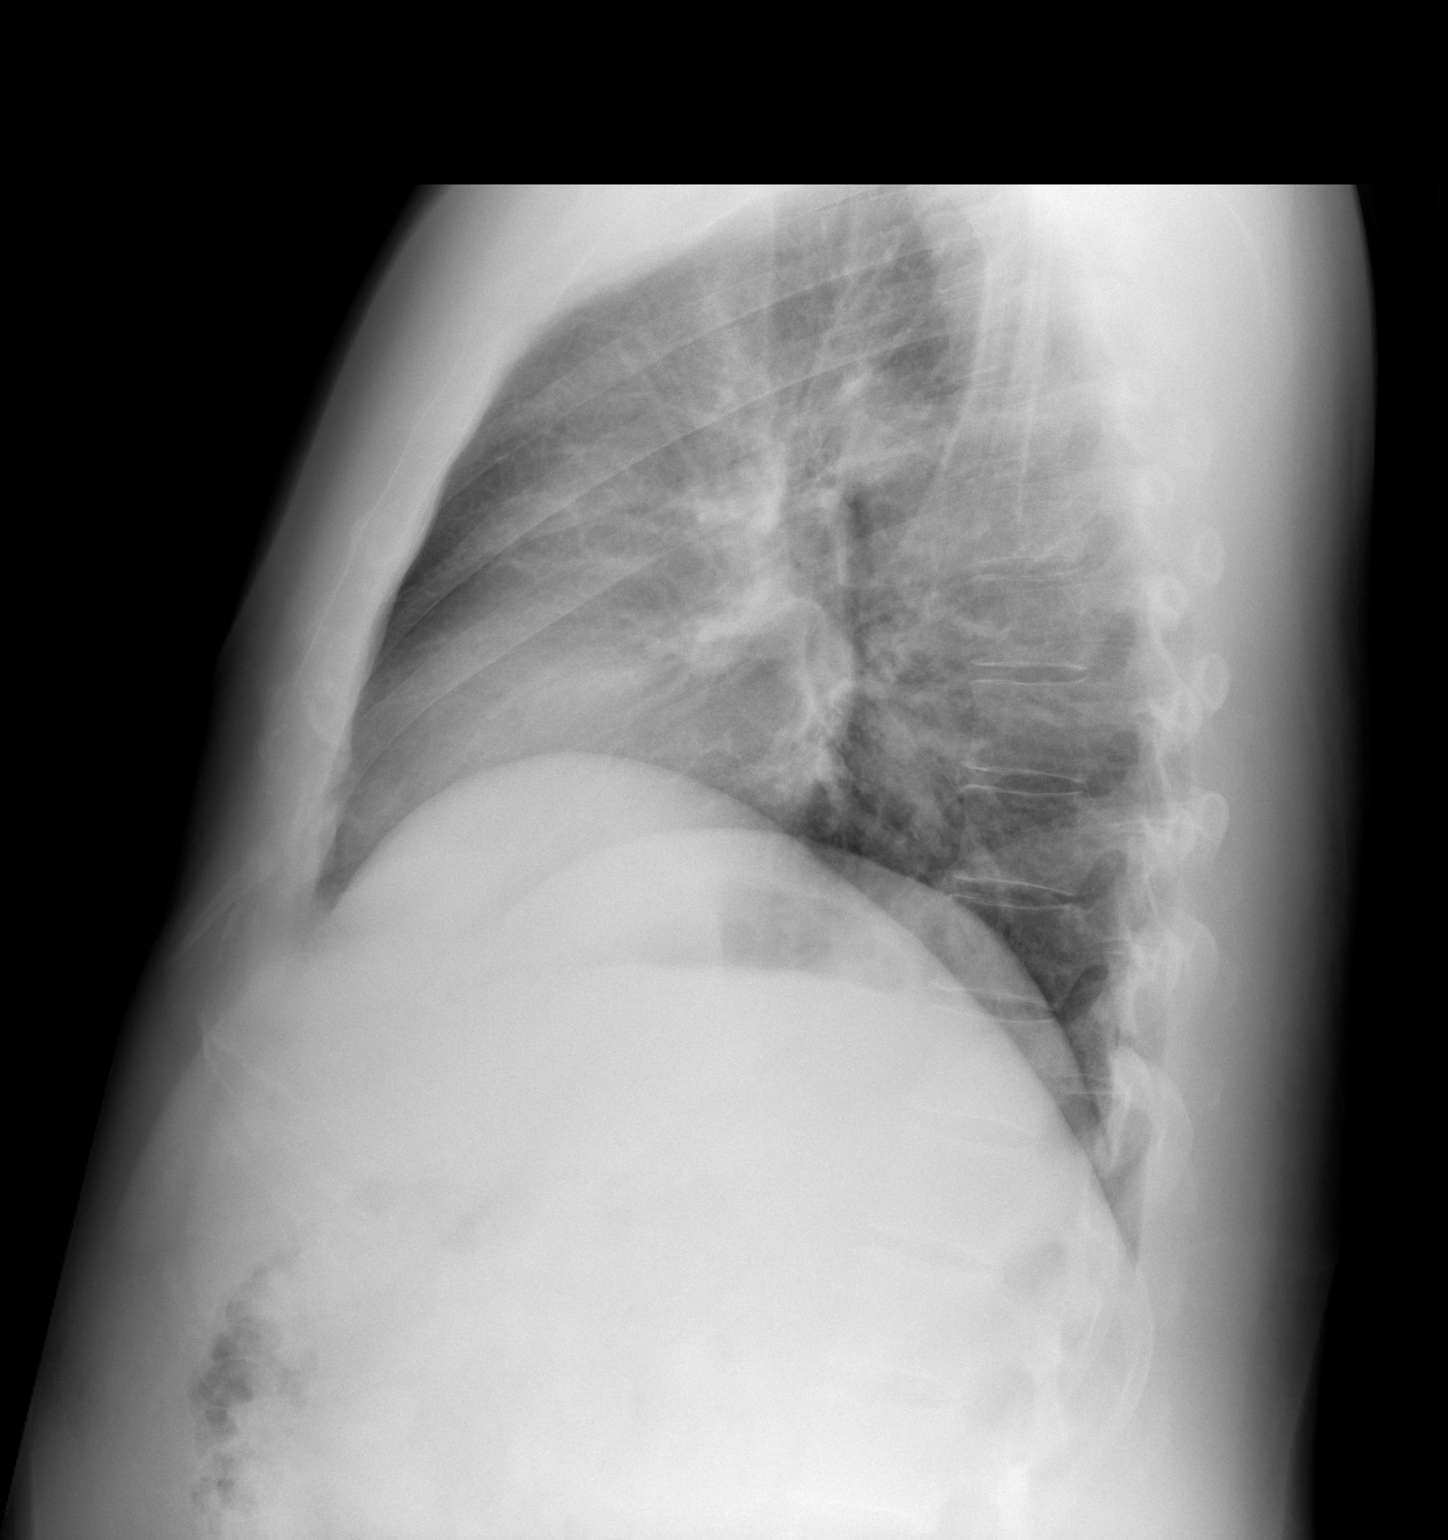

[2 of 2 positions shown; findings below may reference images not displayed]

FINDINGS: The lungs are relatively well-aerated and clear.  There
is no evidence of focal opacification, pleural effusion or
pneumothorax.  Pulmonary vascularity is at the upper limits of
normal.

The heart is normal in size; the mediastinal contour is within
normal limits.  No acute osseous abnormalities are seen.
IMPRESSION: No acute cardiopulmonary process seen.

## 2017-01-03 ENCOUNTER — Encounter: Payer: Self-pay | Admitting: Podiatry

## 2017-01-03 ENCOUNTER — Ambulatory Visit: Payer: BLUE CROSS/BLUE SHIELD | Admitting: Podiatry

## 2017-01-03 DIAGNOSIS — B353 Tinea pedis: Secondary | ICD-10-CM | POA: Diagnosis not present

## 2017-01-03 DIAGNOSIS — L98491 Non-pressure chronic ulcer of skin of other sites limited to breakdown of skin: Secondary | ICD-10-CM

## 2017-01-03 MED ORDER — TERBINAFINE HCL 250 MG PO TABS: 250.0000 mg | ORAL_TABLET | Freq: Every day | ORAL | 2 refills | Status: AC

## 2017-01-03 NOTE — Progress Notes (Signed)
SUBJECTIVE: 46 y.o. year old male presents complaining of painful feet in between digits of both feet. He has had this condition for the past 2 years off and on. Been treated with antibiotics whenever it flares up. Stated that both feet are very painful at night and keeps him awake.  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   Surgical history: Right shoulder surgery 10 years ago. Medical: Hypertension under medical management.   Review of Systems  Constitutional: Negative.   HENT: Negative.   Eyes: Negative.   Respiratory: Negative.   Cardiovascular: Negative.   Gastrointestinal: Negative.   Genitourinary: Negative.   Musculoskeletal: Negative.   Skin: Negative.     OBJECTIVE: DERMATOLOGIC EXAMINATION: Wet macerated interdigital space with loose white devitalized skin debris, 3rd and 4th web space bilateral.  VASCULAR EXAMINATION OF LOWER LIMBS: All pedal pulses are palpable with normal pulsation.  Mild erythema along the macerated skin surface involving 3rd, 4th, and 5th digits bilateral. Temperature gradient from tibial crest to dorsum of foot is within normal bilateral.  NEUROLOGIC EXAMINATION OF THE LOWER LIMBS: All epicritic and tactile sensations grossly intact. Sharp and Dull discriminatory sensations at the plantar ball of hallux is intact bilateral.   MUSCULOSKELETAL EXAMINATION: Rectus foot with tight interdigital space bilateral.   ASSESSMENT: Tinea Pedis 3rd and 4th web space bilateral. Macerated skin involving 3rd, 4th, and 5th bilateral.  PLAN: Reviewed findings and available treatment options. Rx. Lamisil prescribed to take for 2 weeks. Advised to cleanse and soak in Vinegar water or Salsun blue shampoo daily. Antifungal and anesthetic compound cream prescribed for pain.  Proper shoe gear reviewed to promote aeration of digits. Return in one month.

## 2017-01-03 NOTE — Patient Instructions (Addendum)
Seen for interdigital skin lesion. Possible fungal and bacterial infection.  Lamisil po tablets and compounding cream prescribed. Do daily cleansing with Salsun blue shampoo. Apply Tinactin foot powder with cotton ball in between digits during the day.  Reviewed proper shoe gear. Return in one month.

## 2017-02-05 ENCOUNTER — Ambulatory Visit: Payer: BLUE CROSS/BLUE SHIELD | Admitting: Podiatry

## 2017-07-15 ENCOUNTER — Emergency Department (HOSPITAL_COMMUNITY)
Admission: EM | Admit: 2017-07-15 | Discharge: 2017-07-15 | Disposition: A | Discharge status: Home / Self Care | Payer: BLUE CROSS/BLUE SHIELD | Attending: Emergency Medicine | Admitting: Emergency Medicine

## 2017-07-15 ENCOUNTER — Encounter (HOSPITAL_COMMUNITY): Payer: Self-pay | Admitting: Emergency Medicine

## 2017-07-15 ENCOUNTER — Emergency Department (HOSPITAL_COMMUNITY): Payer: BLUE CROSS/BLUE SHIELD

## 2017-07-15 DIAGNOSIS — Z79899 Other long term (current) drug therapy: Secondary | ICD-10-CM | POA: Insufficient documentation

## 2017-07-15 DIAGNOSIS — Z87891 Personal history of nicotine dependence: Secondary | ICD-10-CM | POA: Insufficient documentation

## 2017-07-15 DIAGNOSIS — Y999 Unspecified external cause status: Secondary | ICD-10-CM | POA: Diagnosis not present

## 2017-07-15 DIAGNOSIS — S31111A Laceration without foreign body of abdominal wall, left upper quadrant without penetration into peritoneal cavity, initial encounter: Secondary | ICD-10-CM | POA: Diagnosis not present

## 2017-07-15 DIAGNOSIS — I10 Essential (primary) hypertension: Secondary | ICD-10-CM | POA: Insufficient documentation

## 2017-07-15 DIAGNOSIS — W3400XA Accidental discharge from unspecified firearms or gun, initial encounter: Secondary | ICD-10-CM

## 2017-07-15 DIAGNOSIS — Z23 Encounter for immunization: Secondary | ICD-10-CM | POA: Insufficient documentation

## 2017-07-15 DIAGNOSIS — Y9389 Activity, other specified: Secondary | ICD-10-CM | POA: Diagnosis not present

## 2017-07-15 DIAGNOSIS — T148XXA Other injury of unspecified body region, initial encounter: Secondary | ICD-10-CM

## 2017-07-15 DIAGNOSIS — Y92018 Other place in single-family (private) house as the place of occurrence of the external cause: Secondary | ICD-10-CM | POA: Diagnosis not present

## 2017-07-15 DIAGNOSIS — W320XXA Accidental handgun discharge, initial encounter: Secondary | ICD-10-CM | POA: Diagnosis not present

## 2017-07-15 DIAGNOSIS — S3991XA Unspecified injury of abdomen, initial encounter: Secondary | ICD-10-CM | POA: Diagnosis present

## 2017-07-15 LAB — I-STAT CHEM 8, ED
BUN: 13 mg/dL (ref 6–20)
CALCIUM ION: 1.09 mmol/L — AB (ref 1.15–1.40)
CHLORIDE: 105 mmol/L (ref 101–111)
Creatinine, Ser: 1.1 mg/dL (ref 0.61–1.24)
Glucose, Bld: 148 mg/dL — ABNORMAL HIGH (ref 65–99)
HEMATOCRIT: 51 % (ref 39.0–52.0)
Hemoglobin: 17.3 g/dL — ABNORMAL HIGH (ref 13.0–17.0)
Potassium: 3.4 mmol/L — ABNORMAL LOW (ref 3.5–5.1)
SODIUM: 142 mmol/L (ref 135–145)
TCO2: 24 mmol/L (ref 22–32)

## 2017-07-15 LAB — BPAM FFP
BLOOD PRODUCT EXPIRATION DATE: 201906192359
Blood Product Expiration Date: 201906192359
ISSUE DATE / TIME: 201906161351
ISSUE DATE / TIME: 201906161351
UNIT TYPE AND RH: 6200
Unit Type and Rh: 6200

## 2017-07-15 LAB — BPAM RBC
Blood Product Expiration Date: 201906212359
Blood Product Expiration Date: 201906232359
ISSUE DATE / TIME: 201906161350
ISSUE DATE / TIME: 201906161350
Unit Type and Rh: 9500
Unit Type and Rh: 9500

## 2017-07-15 LAB — TYPE AND SCREEN
UNIT DIVISION: 0
Unit division: 0

## 2017-07-15 LAB — CBC
HCT: 51.7 % (ref 39.0–52.0)
HEMOGLOBIN: 17.6 g/dL — AB (ref 13.0–17.0)
MCH: 30 pg (ref 26.0–34.0)
MCHC: 34 g/dL (ref 30.0–36.0)
MCV: 88.1 fL (ref 78.0–100.0)
PLATELETS: 288 10*3/uL (ref 150–400)
RBC: 5.87 MIL/uL — AB (ref 4.22–5.81)
RDW: 13.1 % (ref 11.5–15.5)
WBC: 9.6 10*3/uL (ref 4.0–10.5)

## 2017-07-15 LAB — PREPARE FRESH FROZEN PLASMA
Unit division: 0
Unit division: 0

## 2017-07-15 MED ORDER — BACITRACIN ZINC 500 UNIT/GM EX OINT: TOPICAL_OINTMENT | Freq: Two times a day (BID) | CUTANEOUS | Status: DC

## 2017-07-15 MED ORDER — TETANUS-DIPHTH-ACELL PERTUSSIS 5-2.5-18.5 LF-MCG/0.5 IM SUSP
0.5000 mL | Freq: Once | INTRAMUSCULAR | Status: AC
  Administered 2017-07-15: 0.5 mL via INTRAMUSCULAR
  Filled 2017-07-15: qty 0.5

## 2017-07-15 MED ORDER — ACETAMINOPHEN 325 MG PO TABS: 650.0000 mg | ORAL_TABLET | Freq: Four times a day (QID) | ORAL | 0 refills | Status: AC | PRN

## 2017-07-15 MED ORDER — NAPROXEN 500 MG PO TABS: 500.0000 mg | ORAL_TABLET | Freq: Two times a day (BID) | ORAL | 0 refills | Status: AC

## 2017-07-15 MED ORDER — HYDROMORPHONE HCL 2 MG/ML IJ SOLN
1.0000 mg | Freq: Once | INTRAMUSCULAR | Status: AC
  Administered 2017-07-15: 1 mg via INTRAMUSCULAR
  Filled 2017-07-15: qty 1

## 2017-07-15 MED ORDER — HYDROMORPHONE HCL 2 MG/ML IJ SOLN: 1.0000 mg | Freq: Once | INTRAMUSCULAR | Status: DC

## 2017-07-15 MED ORDER — BACITRACIN ZINC 500 UNIT/GM EX OINT: 1.0000 "application " | TOPICAL_OINTMENT | Freq: Two times a day (BID) | CUTANEOUS | 0 refills | Status: AC

## 2017-07-15 MED ORDER — OXYCODONE-ACETAMINOPHEN 5-325 MG PO TABS: 1.0000 | ORAL_TABLET | Freq: Three times a day (TID) | ORAL | 0 refills | Status: AC | PRN
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
## 2017-07-15 NOTE — ED Notes (Signed)
The pt  Has high bp and takes medicine for it
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
## 2020-04-21 ENCOUNTER — Other Ambulatory Visit: Payer: Self-pay

## 2020-04-21 ENCOUNTER — Emergency Department (HOSPITAL_BASED_OUTPATIENT_CLINIC_OR_DEPARTMENT_OTHER): Payer: BC Managed Care – PPO

## 2020-04-21 ENCOUNTER — Emergency Department (HOSPITAL_BASED_OUTPATIENT_CLINIC_OR_DEPARTMENT_OTHER)
Admission: EM | Admit: 2020-04-21 | Discharge: 2020-04-21 | Disposition: A | Discharge status: Home / Self Care | Payer: BC Managed Care – PPO | Attending: Emergency Medicine | Admitting: Emergency Medicine

## 2020-04-21 ENCOUNTER — Encounter (HOSPITAL_BASED_OUTPATIENT_CLINIC_OR_DEPARTMENT_OTHER): Payer: Self-pay | Admitting: Emergency Medicine

## 2020-04-21 ENCOUNTER — Other Ambulatory Visit (HOSPITAL_COMMUNITY): Payer: Self-pay | Admitting: Emergency Medicine

## 2020-04-21 DIAGNOSIS — R079 Chest pain, unspecified: Secondary | ICD-10-CM

## 2020-04-21 DIAGNOSIS — R072 Precordial pain: Secondary | ICD-10-CM | POA: Insufficient documentation

## 2020-04-21 DIAGNOSIS — I10 Essential (primary) hypertension: Secondary | ICD-10-CM | POA: Diagnosis not present

## 2020-04-21 DIAGNOSIS — Z79899 Other long term (current) drug therapy: Secondary | ICD-10-CM | POA: Insufficient documentation

## 2020-04-21 LAB — CBC WITH DIFFERENTIAL/PLATELET
Abs Immature Granulocytes: 0.08 10*3/uL — ABNORMAL HIGH (ref 0.00–0.07)
Basophils Absolute: 0.1 10*3/uL (ref 0.0–0.1)
Basophils Relative: 1 %
Eosinophils Absolute: 0.2 10*3/uL (ref 0.0–0.5)
Eosinophils Relative: 2 %
HCT: 47.9 % (ref 39.0–52.0)
Hemoglobin: 16.9 g/dL (ref 13.0–17.0)
Immature Granulocytes: 1 %
Lymphocytes Relative: 31 %
Lymphs Abs: 2.6 10*3/uL (ref 0.7–4.0)
MCH: 31.2 pg (ref 26.0–34.0)
MCHC: 35.3 g/dL (ref 30.0–36.0)
MCV: 88.4 fL (ref 80.0–100.0)
Monocytes Absolute: 0.6 10*3/uL (ref 0.1–1.0)
Monocytes Relative: 7 %
Neutro Abs: 4.7 10*3/uL (ref 1.7–7.7)
Neutrophils Relative %: 58 %
Platelets: 252 10*3/uL (ref 150–400)
RBC: 5.42 MIL/uL (ref 4.22–5.81)
RDW: 13.6 % (ref 11.5–15.5)
WBC: 8.2 10*3/uL (ref 4.0–10.5)
nRBC: 0 % (ref 0.0–0.2)

## 2020-04-21 LAB — HEPATIC FUNCTION PANEL
ALT: 34 U/L (ref 0–44)
AST: 23 U/L (ref 15–41)
Albumin: 3.8 g/dL (ref 3.5–5.0)
Alkaline Phosphatase: 93 U/L (ref 38–126)
Bilirubin, Direct: <0.1 mg/dL (ref 0.0–0.2)
Total Bilirubin: 0.5 mg/dL (ref 0.3–1.2)
Total Protein: 6.9 g/dL (ref 6.5–8.1)

## 2020-04-21 LAB — BASIC METABOLIC PANEL
Anion gap: 10 (ref 5–15)
BUN: 14 mg/dL (ref 6–20)
CO2: 22 mmol/L (ref 22–32)
Calcium: 8.8 mg/dL — ABNORMAL LOW (ref 8.9–10.3)
Chloride: 105 mmol/L (ref 98–111)
Creatinine, Ser: 1.15 mg/dL (ref 0.61–1.24)
GFR, Estimated: >60 mL/min (ref >60)
Glucose, Bld: 132 mg/dL — ABNORMAL HIGH (ref 70–99)
Potassium: 3.5 mmol/L (ref 3.5–5.1)
Sodium: 137 mmol/L (ref 135–145)

## 2020-04-21 LAB — TROPONIN I (HIGH SENSITIVITY): Troponin I (High Sensitivity): 4 ng/L (ref <18)

## 2020-04-21 LAB — LIPASE, BLOOD: Lipase: 26 U/L (ref 11–51)

## 2020-04-21 MED ORDER — AMLODIPINE BESYLATE 5 MG PO TABS: 5.0000 mg | ORAL_TABLET | Freq: Every day | ORAL | 0 refills | Status: AC

## 2020-04-21 MED ORDER — HYDROXYZINE HCL 25 MG PO TABS: 25.0000 mg | ORAL_TABLET | Freq: Four times a day (QID) | ORAL | 0 refills | Status: AC

## 2020-04-21 MED FILL — AMLODIPINE BESYLATE 5 MG TA: 5 | 30 days supply | Qty: 30 | Fill #0

## 2020-04-21 MED FILL — hydrOXYzine HCL 25 MG TABS: 25 | 3 days supply | Qty: 12 | Fill #0

## 2020-04-21 NOTE — Discharge Instructions (Addendum)
Follow-up with your primary care doctor to further work-up episodes of chest pain/anxiety.  Suspect that this is mostly anxiety related.  Take Vistaril as needed for anxiety.  Start amlodipine for high blood pressure.Please return if you have any worsening pain especially exertional chest pain

## 2020-04-21 NOTE — ED Provider Notes (Signed)
[facility] EMERGENCY DEPARTMENT Provider Note   CSN: [id number] Arrival date & time: 04/21/20  1213     History Chief Complaint  Patient presents with  . Chest Pain    Steven Tyler is a 50 y.o. male.  Patient states had chest pain on Saturday.  History of anxiety and possibly similar to previous panic attacks.  Has been under stress with a sick family member in the hospital now.  Had another episode earlier this morning but currently Steven chest pain.  Has had high blood pressure.  Denies any shortness of breath, chest pain currently.  Steven recent surgery or travel.  Denies any cough or infectious symptoms.  The history is provided by the patient.  Chest Pain Pain location:  Substernal area Pain quality: aching   Pain radiates to:  Does not radiate Pain severity:  Steven pain Onset quality:  Gradual Progression:  Resolved Context: at rest   Relieved by:  Nothing Worsened by:  Nothing Associated symptoms: Steven abdominal pain, Steven back pain, Steven cough, Steven fever, Steven palpitations, Steven shortness of breath and Steven vomiting   Risk factors: hypertension   Risk factors: Steven coronary artery disease        Past Medical History:  Diagnosis Date  . Anxiety   . Hypertension     Patient Active Problem List   Diagnosis Date Noted  . Tinea pedis of both feet 01/03/2017    Past Surgical History:  Procedure Laterality Date  . SHOULDER SURGERY    . SHOULDER SURGERY         Steven family history on file.  Social History   Tobacco Use  . Smoking status: Former Games developer  . Smokeless tobacco: Current User  Substance Use Topics  . Alcohol use: Steven  . Drug use: Steven    Home Medications Prior to Admission medications   Medication Sig Start Date End Date Taking? Authorizing Provider  amLODipine (NORVASC) 5 MG tablet Take 1 tablet (5 mg total) by mouth daily. 04/21/20 05/21/20 Yes Meade Hogeland, DO  hydrOXYzine (ATARAX/VISTARIL) 25 MG tablet Take 1 tablet (25 mg total) by mouth every 6  (six) hours. 04/21/20  Yes Roya Gieselman, DO  acetaminophen (TYLENOL) 325 MG tablet Take 2 tablets (650 mg total) by mouth every 6 (six) hours as needed. 07/15/17   Derwood Kaplan, MD  bacitracin ointment Apply 1 application topically 2 (two) times daily. 07/15/17   Derwood Kaplan, MD  guaiFENesin-codeine (ROBITUSSIN AC) 100-10 MG/5ML syrup Take 5 mLs by mouth 3 (three) times daily as needed for cough. Patient not taking: Reported on 07/15/2017 02/19/12   Jethro Bastos, NP  ibuprofen (ADVIL,MOTRIN) 200 MG tablet Take 800 mg by mouth daily as needed for moderate pain (knee pain).    [provider]  ibuprofen (ADVIL,MOTRIN) 600 MG tablet Take 1 tablet (600 mg total) by mouth every 6 (six) hours as needed for pain. Patient not taking: Reported on 07/15/2017 02/19/12   Jethro Bastos, NP  loratadine (CLARITIN) 10 MG tablet Take 1 tablet (10 mg total) by mouth daily. 02/19/12   Jethro Bastos, NP  metoprolol succinate (TOPROL-XL) 100 MG 24 hr tablet Take 100 mg by mouth daily. 12/05/16   [provider]  naproxen (NAPROSYN) 500 MG tablet Take 1 tablet (500 mg total) by mouth 2 (two) times daily. 07/15/17   Derwood Kaplan, MD  oxyCODONE-acetaminophen (PERCOCET/ROXICET) 5-325 MG tablet Take 1 tablet by mouth every 8 (eight) hours as needed for severe pain.  07/15/17   Derwood Kaplan, MD  Ranitidine HCl (ZANTAC PO) Take by mouth.    [provider]  terbinafine (LAMISIL) 250 MG tablet Take 1 tablet (250 mg total) by mouth daily. Patient not taking: Reported on 07/15/2017 01/03/17   Charlett Nose, DPM    Allergies    Hydrocodone-acetaminophen  Review of Systems   Review of Systems  Constitutional: Negative for chills and fever.  HENT: Negative for ear pain and sore throat.   Eyes: Negative for pain and visual disturbance.  Respiratory: Negative for cough and shortness of breath.   Cardiovascular: Positive for chest pain. Negative for palpitations.  Gastrointestinal:  Negative for abdominal pain and vomiting.  Genitourinary: Negative for dysuria and hematuria.  Musculoskeletal: Negative for arthralgias and back pain.  Skin: Negative for color change and rash.  Neurological: Negative for seizures and syncope.  Psychiatric/Behavioral: The patient is nervous/anxious.   All other systems reviewed and are negative.   Physical Exam Updated Vital Signs BP (!) 172/114   Pulse 75   Resp 14   Ht 6' (1.829 m)   Wt 136.1 kg   SpO2 96%   BMI 40.69 kg/m   Physical Exam Vitals and nursing note reviewed.  Constitutional:      General: He is not in acute distress.    Appearance: He is well-developed. He is not ill-appearing.  HENT:     Head: Normocephalic and atraumatic.  Eyes:     Conjunctiva/sclera: Conjunctivae normal.     Pupils: Pupils are equal, round, and reactive to light.  Cardiovascular:     Rate and Rhythm: Normal rate and regular rhythm.     Pulses:          Radial pulses are 2+ on the right side and 2+ on the left side.     Heart sounds: Steven murmur heard.   Pulmonary:     Effort: Pulmonary effort is normal. Steven respiratory distress.     Breath sounds: Normal breath sounds. Steven decreased breath sounds or wheezing.  Abdominal:     Palpations: Abdomen is soft.     Tenderness: There is Steven abdominal tenderness.  Musculoskeletal:        General: Normal range of motion.     Cervical back: Normal range of motion and neck supple.     Right lower leg: Steven edema.     Left lower leg: Steven edema.  Skin:    General: Skin is warm and dry.     Capillary Refill: Capillary refill takes less than 2 seconds.  Neurological:     General: Steven focal deficit present.     Mental Status: He is alert.     ED Results / Procedures / Treatments   Labs (all labs ordered are listed, but only abnormal results are displayed) Labs Reviewed  CBC WITH DIFFERENTIAL/PLATELET - Abnormal; Notable for the following components:      Result Value   Abs Immature  Granulocytes 0.08 (*)    All other components within normal limits  BASIC METABOLIC PANEL - Abnormal; Notable for the following components:   Glucose, Bld 132 (*)    Calcium 8.8 (*)    All other components within normal limits  HEPATIC FUNCTION PANEL  LIPASE, BLOOD  TROPONIN I (HIGH SENSITIVITY)    EKG EKG Interpretation  Date/Time:  Wednesday April 21 2020 12:28:34 EDT Ventricular Rate:  92 PR Interval:    QRS Duration: 101 QT Interval:  378 QTC Calculation: 468 R Axis:  161 Text Interpretation: Sinus rhythm Probable right ventricular hypertrophy Nonspecific T abnormalities, inferior leads Steven significant change since last tracing Confirmed by Virgina Norfolk [phone]) on 04/21/2020 12:39:53 PM   Radiology DG Chest Portable 1 View  Result Date: 04/21/2020 CLINICAL DATA:  50 year old male with history of central chest pain for the past 4 days. EXAM: PORTABLE CHEST 1 VIEW COMPARISON:  Chest x-ray 02/19/2012. FINDINGS: Lung volumes are normal. Steven consolidative airspace disease. Steven pleural effusions. Steven pneumothorax. Steven pulmonary nodule or mass noted. Pulmonary vasculature and the cardiomediastinal silhouette are within normal limits. IMPRESSION: Steven radiographic evidence of acute cardiopulmonary disease. Electronically Signed   By: Trudie Reed M.D.   On: 04/21/2020 12:52    Procedures Procedures   Medications Ordered in ED Medications - Steven data to display  ED Course  I have reviewed the triage vital signs and the nursing notes.  Pertinent labs & imaging results that were available during my care of the patient were reviewed by me and considered in my medical decision making (see chart for details).    MDM Rules/Calculators/A&P                          Zyier Dykema Dejoseph is here with high blood pressure.  Has had some chest pain as well but none currently.  Normal vitals except for hypertension.  Heart score is 2.  Overall atypical sounding story.  History of panic attacks and  suspect possibly similar events recently.  Family member sick in the hospital currently.  Actively has Steven chest pain or shortness of breath.  Has not had any issues for hours.  Had 1 episode of chest pain on Saturday and another episode earlier today.  Blood pressure has not been very well controlled but he feels anxious.  Overall exam is unremarkable.  Clear breath sounds.  Chest x-ray shows Steven signs of infection.  EKG shows sinus rhythm.  Steven ischemic changes.  Steven new ischemic changes from prior EKGs.  Will check a troponin and basic labs.  Overall suspect possibly anxiety or GI related pain.  Troponin normal.  Steven significant anemia, electrolyte abnormality, kidney injury.  Overall suspect likely related to anxiety.  Heart score is 2.  Has not had any chest pain for a long time at this point.  Do not think second troponin is needed as Steven chest pain for more than 2 hours.  Will prescribe Vistaril for anxiety.  Will start amlodipine for blood pressure.  We will have him follow-up with his primary care doctor.  He understands to return if symptoms worsen including if he develops any exertional chest pain.  Steven stroke symptoms.  Discharged in good condition.  This chart was dictated using voice recognition software.  Despite best efforts to proofread,  errors can occur which can change the documentation meaning.    Final Clinical Impression(s) / ED Diagnoses Final diagnoses:  Nonspecific chest pain    Rx / DC Orders ED Discharge Orders         Ordered    hydrOXYzine (ATARAX/VISTARIL) 25 MG tablet  Every 6 hours        04/21/20 1414    amLODipine (NORVASC) 5 MG tablet  Daily        04/21/20 1414           Nasiir Monts, Madelaine Bhat, DO 04/21/20 1416

## 2020-04-21 NOTE — ED Triage Notes (Signed)
Reports central chest pain since Saturday.  Initially felt like pressure now is a soreness.

## 2022-04-22 ENCOUNTER — Emergency Department (HOSPITAL_COMMUNITY): Payer: BC Managed Care – PPO

## 2022-04-22 ENCOUNTER — Emergency Department (HOSPITAL_COMMUNITY)
Admission: EM | Admit: 2022-04-22 | Discharge: 2022-04-23 | Disposition: A | Discharge status: Home / Self Care | Payer: BC Managed Care – PPO | Attending: Emergency Medicine | Admitting: Emergency Medicine

## 2022-04-22 ENCOUNTER — Encounter (HOSPITAL_COMMUNITY): Payer: Self-pay

## 2022-04-22 DIAGNOSIS — E860 Dehydration: Secondary | ICD-10-CM | POA: Diagnosis not present

## 2022-04-22 DIAGNOSIS — I10 Essential (primary) hypertension: Secondary | ICD-10-CM | POA: Insufficient documentation

## 2022-04-22 DIAGNOSIS — E876 Hypokalemia: Secondary | ICD-10-CM | POA: Diagnosis not present

## 2022-04-22 DIAGNOSIS — Z79899 Other long term (current) drug therapy: Secondary | ICD-10-CM | POA: Insufficient documentation

## 2022-04-22 DIAGNOSIS — R55 Syncope and collapse: Secondary | ICD-10-CM | POA: Insufficient documentation

## 2022-04-22 LAB — CBC
HCT: 51.5 % (ref 39.0–52.0)
Hemoglobin: 17.6 g/dL — ABNORMAL HIGH (ref 13.0–17.0)
MCH: 30.4 pg (ref 26.0–34.0)
MCHC: 34.2 g/dL (ref 30.0–36.0)
MCV: 88.9 fL (ref 80.0–100.0)
Platelets: 273 10*3/uL (ref 150–400)
RBC: 5.79 MIL/uL (ref 4.22–5.81)
RDW: 13.4 % (ref 11.5–15.5)
WBC: 10.8 10*3/uL — ABNORMAL HIGH (ref 4.0–10.5)
nRBC: 0 % (ref 0.0–0.2)

## 2022-04-22 MED ORDER — LORAZEPAM 1 MG PO TABS
0.5000 mg | ORAL_TABLET | Freq: Once | ORAL | Status: AC
  Administered 2022-04-22: 0.5 mg via ORAL
  Filled 2022-04-22: qty 1

## 2022-04-22 MED ORDER — LACTATED RINGERS IV BOLUS
1000.0000 mL | Freq: Once | INTRAVENOUS | Status: AC
  Administered 2022-04-23: 1000 mL via INTRAVENOUS

## 2022-04-22 NOTE — ED Provider Notes (Addendum)
[facility] Provider Note   CSN: [id number] Arrival date & time: 04/22/22  2213     History  Chief Complaint  Patient presents with   Loss of Consciousness    Patient to ED via EMS with complaint of syncopal episode and palpitations at home patient reports taking new BP meds and has a headache after waking up. Patient states he=is forehead is sore. Patient denies blood thinners    Steven Tyler is a 52 y.o. male with medical history of anxiety and hypertension.  Patient presents to ED for evaluation of syncope.  Patient states that for the last 4 days he has been doing in excess amount of housework, cleaning around the house.  Patient states that he is currently going through divorce with his wife, his wife recently moved out of the house and took the children.  The patient reports that he has been trying to clean the house up to show his wife that he is "trying".  Patient states that at some point today while he was doing housework he became very tired and laid down.  Patient states that he realized at this time he had not drink any water in the last 5 days and he drink a water bottle very quickly.  Patient states that he then fell asleep, woke up with an extreme urge to urinate.  Patient states that he then got up out of bed, walked around his bed and then became lightheaded.  Patient reports that next he remembers he was on the floor and he had urinated on himself.  The patient denies history of seizures, denies any tongue biting, denies any confusion after waking up.  Patient reports that he thinks that he hit his head when he fell to the floor, he denies blood thinners.  Patient states that this time he called his wife and then he called 911.  Patient reports #1 came out and transported him to ED for evaluation.  The patient reports that prior to syncope he had an episode of chest heaviness along with shortness of breath.  The patient goes on to tell  me that he recently saw his PCP for hypertension.  The patient PCP increased his dosage of losartan hydrochlorothiazide to 100-25 mg.  Patient was originally on 50-25.  Patient states that ever since having increased dosage of losartan/HCTZ he has felt like he has had an increase in energy.  Patient reports that he has been acting like a "crack head".  Patient denies any nausea, vomiting, fevers.  Patient denies any drug or alcohol use.   Loss of Consciousness Associated symptoms: shortness of breath        Home Medications Prior to Admission medications   Medication Sig Start Date End Date Taking? Authorizing Provider  potassium chloride SA (KLOR-CON M) 20 MEQ tablet Take 1 tablet (20 mEq total) by mouth 2 (two) times daily. 04/23/22  Yes Azucena Cecil, PA-C  acetaminophen (TYLENOL) 325 MG tablet Take 2 tablets (650 mg total) by mouth every 6 (six) hours as needed. 07/15/17   Varney Biles, MD  amLODipine (NORVASC) 5 MG tablet Take 1 tablet (5 mg total) by mouth daily. 04/21/20 05/21/20  Curatolo, Adam, DO  amLODipine (NORVASC) 5 MG tablet TAKE 1 TABLET (5 MG TOTAL) BY MOUTH DAILY. 04/21/20 04/21/21  Curatolo, Adam, DO  bacitracin ointment Apply 1 application topically 2 (two) times daily. 07/15/17   Varney Biles, MD  guaiFENesin-codeine (ROBITUSSIN AC) 100-10 MG/5ML syrup  Take 5 mLs by mouth 3 (three) times daily as needed for cough. Patient not taking: Reported on 07/15/2017 02/19/12   Sheryle Hail, NP  hydrOXYzine (ATARAX/VISTARIL) 25 MG tablet Take 1 tablet (25 mg total) by mouth every 6 (six) hours. 04/21/20   Curatolo, Adam, DO  ibuprofen (ADVIL,MOTRIN) 200 MG tablet Take 800 mg by mouth daily as needed for moderate pain (knee pain).    [provider]  ibuprofen (ADVIL,MOTRIN) 600 MG tablet Take 1 tablet (600 mg total) by mouth every 6 (six) hours as needed for pain. Patient not taking: Reported on 07/15/2017 02/19/12   Sheryle Hail, NP  loratadine (CLARITIN) 10 MG  tablet Take 1 tablet (10 mg total) by mouth daily. 02/19/12   Sheryle Hail, NP  metoprolol succinate (TOPROL-XL) 100 MG 24 hr tablet Take 100 mg by mouth daily. 12/05/16   [provider]  naproxen (NAPROSYN) 500 MG tablet Take 1 tablet (500 mg total) by mouth 2 (two) times daily. 07/15/17   Varney Biles, MD  oxyCODONE-acetaminophen (PERCOCET/ROXICET) 5-325 MG tablet Take 1 tablet by mouth every 8 (eight) hours as needed for severe pain. 07/15/17   Varney Biles, MD  Ranitidine HCl (ZANTAC PO) Take by mouth.    [provider]  terbinafine (LAMISIL) 250 MG tablet Take 1 tablet (250 mg total) by mouth daily. Patient not taking: Reported on 07/15/2017 01/03/17   Camelia Phenes, DPM      Allergies    Hydrocodone-acetaminophen    Review of Systems   Review of Systems  Respiratory:  Positive for chest tightness and shortness of breath.   Cardiovascular:  Positive for syncope.  Neurological:  Positive for syncope.  All other systems reviewed and are negative.   Physical Exam Updated Vital Signs BP (!) 151/86   Pulse 66   Temp 97.7 F (36.5 C) (Oral)   Resp 14   Ht 6' (1.829 m)   Wt 133.8 kg   SpO2 96%   BMI 40.01 kg/m  Physical Exam Vitals and nursing note reviewed.  Constitutional:      General: He is not in acute distress.    Appearance: Normal appearance. He is not ill-appearing, toxic-appearing or diaphoretic.  HENT:     Head: Normocephalic.     Comments: Superficial abrasion to right forehead    Nose: Nose normal.     Mouth/Throat:     Mouth: Mucous membranes are moist.  Eyes:     Extraocular Movements: Extraocular movements intact.     Conjunctiva/sclera: Conjunctivae normal.     Pupils: Pupils are equal, round, and reactive to light.  Cardiovascular:     Rate and Rhythm: Normal rate and regular rhythm.  Pulmonary:     Effort: Pulmonary effort is normal.     Breath sounds: Normal breath sounds. No wheezing.  Abdominal:     General: Abdomen  is flat.     Tenderness: There is no abdominal tenderness.  Musculoskeletal:     Cervical back: Normal range of motion and neck supple. No tenderness.     Right lower leg: No edema.     Left lower leg: No edema.  Skin:    General: Skin is warm and dry.     Capillary Refill: Capillary refill takes less than 2 seconds.  Neurological:     General: No focal deficit present.     Mental Status: He is alert and oriented to person, place, and time.     GCS: GCS eye subscore  is 4. GCS verbal subscore is 5. GCS motor subscore is 6.     Cranial Nerves: Cranial nerves 2-12 are intact. No cranial nerve deficit.     Sensory: Sensation is intact. No sensory deficit.     Motor: Motor function is intact. No weakness.     Coordination: Coordination is intact. Heel to [facility] Test normal.     Comments: Cranial nerves II through XII intact.  No pronator drift.  Intact finger-nose, heel-to-shin.  5 out of 5 grip strength upper extremities bilaterally.  5 out of 5 strength bilateral lower extremities.  No slurred speech, no facial droop.     ED Results / Procedures / Treatments   Labs (all labs ordered are listed, but only abnormal results are displayed) Labs Reviewed  BASIC METABOLIC PANEL - Abnormal; Notable for the following components:      Result Value   Potassium 2.8 (*)    All other components within normal limits  CBC - Abnormal; Notable for the following components:   WBC 10.8 (*)    Hemoglobin 17.6 (*)    All other components within normal limits  D-DIMER, QUANTITATIVE  TROPONIN I (HIGH SENSITIVITY)  TROPONIN I (HIGH SENSITIVITY)    EKG EKG Interpretation  Date/Time:  Saturday April 22 2022 22:22:42 EDT Ventricular Rate:  77 PR Interval:  164 QRS Duration: 109 QT Interval:  427 QTC Calculation: 484 R Axis:   176 Text Interpretation: Sinus rhythm Probable left atrial enlargement Right axis deviation Borderline repolarization abnormality Borderline prolonged QT interval No significant  change since last tracing Confirmed by Isla Pence [phone]) on 04/22/2022 10:42:46 PM  Radiology CT Head Wo Contrast  Result Date: 04/23/2022 CLINICAL DATA:  Head trauma, moderate-severe EXAM: CT HEAD WITHOUT CONTRAST TECHNIQUE: Contiguous axial images were obtained from the base of the skull through the vertex without intravenous contrast. RADIATION DOSE REDUCTION: This exam was performed according to the departmental dose-optimization program which includes automated exposure control, adjustment of the mA and/or kV according to patient size and/or use of iterative reconstruction technique. COMPARISON:  None Available. FINDINGS: Brain: No evidence of large-territorial acute infarction. No parenchymal hemorrhage. No mass lesion. No extra-axial collection. No mass effect or midline shift. No hydrocephalus. Basilar cisterns are patent. Vascular: No hyperdense vessel. Skull: No acute fracture or focal lesion. Sinuses/Orbits: Paranasal sinuses and mastoid air cells are clear. The orbits are unremarkable. Other: None. IMPRESSION: No acute intracranial abnormality. Electronically Signed   By: Iven Finn M.D.   On: 04/23/2022 00:08   DG Chest Port 1 View  Result Date: 04/22/2022 CLINICAL DATA:  Chest pain and syncopal episode EXAM: PORTABLE CHEST 1 VIEW COMPARISON:  04/21/2020 FINDINGS: Cardiac shadow is stable. Lungs are well aerated bilaterally. No focal infiltrate or effusion is seen. No bony abnormality is noted. IMPRESSION: No active disease. Electronically Signed   By: Inez Catalina M.D.   On: 04/22/2022 23:48    Procedures Procedures   Medications Ordered in ED Medications  lactated ringers bolus 1,000 mL (0 mLs Intravenous Stopped 04/23/22 0128)  LORazepam (ATIVAN) tablet 0.5 mg (0.5 mg Oral Given 04/22/22 2332)  potassium chloride SA (KLOR-CON M) CR tablet 60 mEq (60 mEq Oral Given 04/23/22 0050)    ED Course/ Medical Decision Making/ A&P  Medical Decision Making Amount and/or Complexity  of Data Reviewed Labs: ordered. Radiology: ordered.  Risk Prescription drug management.   52 year old male presents to the ED for evaluation.  Please see HPI for further details.  On examination the patient  is afebrile and nontachycardic.  Lung sounds are clear bilaterally, he is not hypoxic.  The abdomen is soft and compressible throughout.  Neurological examination without focal neurodeficits.  Patient overall nontoxic in appearance.  Patient is able to ambulate steadily around the hospital room without difficulty.  Patient troponin originally 4, delta of 5.  D-dimer not elevated.  BMP with decreased potassium to 2.8 repleted with 60 mEq of oral potassium.  Patient was sent home on potassium.  Patient CBC with slight leukocytosis of 10.8, no anemia.  Chest x-ray shows no consolidations or effusions.  CT head without contrast does not show any evidence of intracranial pathology.  EKG is nonischemic.  Patient given 0.5 mg Ativan for anxiety, 1 L LR.  At this time, patient workup unremarkable.  Most likely patient had syncopal event secondary to decreased fluid intake over the last 5 days.  The patient will be advised to continue hydrating himself at home as an outpatient.  The patient will be sent home with potassium pills and advised to have his labs rechecked in 2 days at PCP office.  The patient voiced understanding of my instructions.  The patient had all his questions answered to his satisfaction.  The patient is stable for discharge.  Of note: Upon discharge, the nurse notified me that the patient's wife had called voicing concern over the patient threatening her.  I reengaged with the patient along with nurse, Gennaro Africa RN, and the patient denied any HI, SI.  The patient reports that him and his wife are currently going through divorce.  Patient goes on to state that the patient wife is repeatedly tried to get him IVC for various issues however he states that he has not threatened her at  all tonight.  The patient was offered a Education officer, museum to discuss shelter resources however the patient deferred on this.  The patient again denied any SI, HI.  Patient reports that he lives in a camper behind their main house however goes on to state that his wife is currently staying with her brother at a different property.  The patient was discharged at this time.   Final Clinical Impression(s) / ED Diagnoses Final diagnoses:  Syncope and collapse  Dehydration    Rx / DC Orders ED Discharge Orders          Ordered    potassium chloride SA (KLOR-CON M) 20 MEQ tablet  2 times daily        04/23/22 0239                    Azucena Cecil, PA-C 04/23/22 0304    Quintella Reichert, MD 04/23/22 [phone]

## 2022-04-22 NOTE — ED Provider Notes (Incomplete)
[facility] Provider Note   CSN: [id number] Arrival date & time: 04/22/22  2213     History  Chief Complaint  Patient presents with  . Loss of Consciousness    Patient to ED via EMS with complaint of syncopal episode and palpitations at home patient reports taking new BP meds and has a headache after waking up. Patient states he=is forehead is sore. Patient denies blood thinners    Steven Tyler is a 52 y.o. male with medical history of anxiety and hypertension.  Patient presents to ED for evaluation of syncope.  Patient states that for the last 4 days he has been doing in excess amount of housework, cleaning around the house.  Patient states that he is currently going through divorce with his wife, his wife recently moved out of the house and took the children.  The patient reports that he has been trying to clean the house up to show his wife that he is "trying".  Patient states that at some point today while he was doing housework he became very tired and laid down.  Patient states that he realized at this time he had not drink any water in the last 5 days and he drink a water bottle very quickly.  Patient states that he then fell asleep, woke up with an extreme urge to urinate.  Patient states that he then got up out of bed, walked around his bed and then became lightheaded.  Patient reports that next he remembers he was on the floor and he had urinated on himself.  The patient denies history of seizures, denies any tongue biting, denies any confusion after waking up.  Patient reports that he thinks that he hit his head when he fell to the floor, he denies blood thinners.  Patient states that this time he called his wife and then he called 911.  Patient reports #1 came out and transported him to ED for evaluation.  The patient reports that prior to syncope he had an episode of chest heaviness along with shortness of breath.  The patient goes on to tell  me that he recently saw his PCP for hypertension.  The patient PCP increased his dosage of losartan hydrochlorothiazide to 100-25 mg.  Patient was originally on 50-25.  Patient states that ever since having increased dosage of losartan/HCTZ he has felt like he has had an less energy.  Patient reports that he has been acting like a "crack head".  Patient denies any nausea, vomiting, fevers.   Loss of Consciousness      Home Medications Prior to Admission medications   Medication Sig Start Date End Date Taking? Authorizing Provider  acetaminophen (TYLENOL) 325 MG tablet Take 2 tablets (650 mg total) by mouth every 6 (six) hours as needed. 07/15/17   Varney Biles, MD  amLODipine (NORVASC) 5 MG tablet Take 1 tablet (5 mg total) by mouth daily. 04/21/20 05/21/20  Curatolo, Adam, DO  amLODipine (NORVASC) 5 MG tablet TAKE 1 TABLET (5 MG TOTAL) BY MOUTH DAILY. 04/21/20 04/21/21  Curatolo, Adam, DO  bacitracin ointment Apply 1 application topically 2 (two) times daily. 07/15/17   Varney Biles, MD  guaiFENesin-codeine (ROBITUSSIN AC) 100-10 MG/5ML syrup Take 5 mLs by mouth 3 (three) times daily as needed for cough. Patient not taking: Reported on 07/15/2017 02/19/12   Sheryle Hail, NP  hydrOXYzine (ATARAX/VISTARIL) 25 MG tablet Take 1 tablet (25 mg total) by mouth every 6 (six) hours.  04/21/20   Curatolo, Adam, DO  ibuprofen (ADVIL,MOTRIN) 200 MG tablet Take 800 mg by mouth daily as needed for moderate pain (knee pain).    [provider]  ibuprofen (ADVIL,MOTRIN) 600 MG tablet Take 1 tablet (600 mg total) by mouth every 6 (six) hours as needed for pain. Patient not taking: Reported on 07/15/2017 02/19/12   Sheryle Hail, NP  loratadine (CLARITIN) 10 MG tablet Take 1 tablet (10 mg total) by mouth daily. 02/19/12   Sheryle Hail, NP  metoprolol succinate (TOPROL-XL) 100 MG 24 hr tablet Take 100 mg by mouth daily. 12/05/16   [provider]  naproxen (NAPROSYN) 500 MG tablet Take 1  tablet (500 mg total) by mouth 2 (two) times daily. 07/15/17   Varney Biles, MD  oxyCODONE-acetaminophen (PERCOCET/ROXICET) 5-325 MG tablet Take 1 tablet by mouth every 8 (eight) hours as needed for severe pain. 07/15/17   Varney Biles, MD  Ranitidine HCl (ZANTAC PO) Take by mouth.    [provider]  terbinafine (LAMISIL) 250 MG tablet Take 1 tablet (250 mg total) by mouth daily. Patient not taking: Reported on 07/15/2017 01/03/17   Camelia Phenes, DPM      Allergies    Hydrocodone-acetaminophen    Review of Systems   Review of Systems  Cardiovascular:  Positive for syncope.    Physical Exam Updated Vital Signs BP (!) 152/87   Pulse 67   Temp 97.7 F (36.5 C) (Oral)   Resp 12   Ht 6' (1.829 m)   Wt 133.8 kg   SpO2 96%   BMI 40.01 kg/m  Physical Exam  ED Results / Procedures / Treatments   Labs (all labs ordered are listed, but only abnormal results are displayed) Labs Reviewed  BASIC METABOLIC PANEL  CBC  TROPONIN I (HIGH SENSITIVITY)    EKG EKG Interpretation  Date/Time:  Saturday April 22 2022 22:22:42 EDT Ventricular Rate:  77 PR Interval:  164 QRS Duration: 109 QT Interval:  427 QTC Calculation: 484 R Axis:   176 Text Interpretation: Sinus rhythm Probable left atrial enlargement Right axis deviation Borderline repolarization abnormality Borderline prolonged QT interval No significant change since last tracing Confirmed by Isla Pence [phone]) on 04/22/2022 10:42:46 PM  Radiology No results found.  Procedures Procedures  {Document cardiac monitor, telemetry assessment procedure when appropriate:1}  Medications Ordered in ED Medications  lactated ringers bolus 1,000 mL (has no administration in time range)  LORazepam (ATIVAN) tablet 0.5 mg (has no administration in time range)    ED Course/ Medical Decision Making/ A&P   {   Click here for ABCD2, HEART and other calculatorsREFRESH Note before signing :1}                           Medical Decision Making Amount and/or Complexity of Data Reviewed Labs: ordered. Radiology: ordered.  Risk Prescription drug management.   ***  {Document critical care time when appropriate:1} {Document review of labs and clinical decision tools ie heart score, Chads2Vasc2 etc:1}  {Document your independent review of radiology images, and any outside records:1} {Document your discussion with family members, caretakers, and with consultants:1} {Document social determinants of health affecting pt's care:1} {Document your decision making why or why not admission, treatments were needed:1} Final Clinical Impression(s) / ED Diagnoses Final diagnoses:  None    Rx / DC Orders ED Discharge Orders     None

## 2022-04-23 LAB — BASIC METABOLIC PANEL
Anion gap: 13 (ref 5–15)
BUN: 15 mg/dL (ref 6–20)
CO2: 26 mmol/L (ref 22–32)
Calcium: 9.4 mg/dL (ref 8.9–10.3)
Chloride: 100 mmol/L (ref 98–111)
Creatinine, Ser: 1.23 mg/dL (ref 0.61–1.24)
GFR, Estimated: >60 mL/min (ref >60)
Glucose, Bld: 99 mg/dL (ref 70–99)
Potassium: 2.8 mmol/L — ABNORMAL LOW (ref 3.5–5.1)
Sodium: 139 mmol/L (ref 135–145)

## 2022-04-23 LAB — TROPONIN I (HIGH SENSITIVITY)
Troponin I (High Sensitivity): 4 ng/L (ref <18)
Troponin I (High Sensitivity): 5 ng/L (ref <18)

## 2022-04-23 LAB — D-DIMER, QUANTITATIVE: D-Dimer, Quant: 0.35 ug/mL-FEU (ref 0.00–0.50)

## 2022-04-23 MED ORDER — POTASSIUM CHLORIDE CRYS ER 20 MEQ PO TBCR: 20.0000 meq | EXTENDED_RELEASE_TABLET | Freq: Two times a day (BID) | ORAL | 0 refills | Status: AC

## 2022-04-23 MED ORDER — POTASSIUM CHLORIDE CRYS ER 20 MEQ PO TBCR
60.0000 meq | EXTENDED_RELEASE_TABLET | Freq: Once | ORAL | Status: AC
  Administered 2022-04-23: 60 meq via ORAL
  Filled 2022-04-23: qty 3

## 2022-04-23 NOTE — Discharge Instructions (Addendum)
Please return to the ED with any new or worsening signs or symptoms such as loss of consciousness, chest pain or shortness of breath Please follow-up with your PCP for further management of hypertension Please follow-up with your PCP in 2 days for recheck of labs, specifically potassium Please begin taking potassium pills I have sent you home with.  You will take them twice daily for the next 3 days Please read attached guide concerning loss of consciousness
